# Patient Record
Sex: Female | Born: 1955
Health system: Southern US, Community
[De-identification: ages and names within clinical notes are randomized; demographics above are authoritative.]

## PROBLEM LIST (undated history)

## (undated) DIAGNOSIS — E663 Overweight: Secondary | ICD-10-CM

## (undated) DIAGNOSIS — K5730 Diverticulosis of large intestine without perforation or abscess without bleeding: Secondary | ICD-10-CM

## (undated) DIAGNOSIS — I341 Nonrheumatic mitral (valve) prolapse: Secondary | ICD-10-CM

## (undated) DIAGNOSIS — T7840XA Allergy, unspecified, initial encounter: Secondary | ICD-10-CM

## (undated) DIAGNOSIS — I10 Essential (primary) hypertension: Secondary | ICD-10-CM

## (undated) DIAGNOSIS — M545 Low back pain, unspecified: Secondary | ICD-10-CM

## (undated) DIAGNOSIS — G5600 Carpal tunnel syndrome, unspecified upper limb: Secondary | ICD-10-CM

## (undated) DIAGNOSIS — K2970 Gastritis, unspecified, without bleeding: Secondary | ICD-10-CM

## (undated) DIAGNOSIS — M1990 Unspecified osteoarthritis, unspecified site: Secondary | ICD-10-CM

## (undated) HISTORY — PX: KNEE ARTHROSCOPY: SUR90

## (undated) HISTORY — DX: Low back pain, unspecified: M54.50

## (undated) HISTORY — DX: Unspecified osteoarthritis, unspecified site: M19.90

## (undated) HISTORY — DX: Overweight: E66.3

## (undated) HISTORY — DX: Gastritis, unspecified, without bleeding: K29.70

## (undated) HISTORY — DX: Low back pain: M54.5

## (undated) HISTORY — DX: Nonrheumatic mitral (valve) prolapse: I34.1

## (undated) HISTORY — PX: OTHER SURGICAL HISTORY: SHX169

## (undated) HISTORY — DX: Allergy, unspecified, initial encounter: T78.40XA

## (undated) HISTORY — DX: Essential (primary) hypertension: I10

## (undated) HISTORY — DX: Carpal tunnel syndrome, unspecified upper limb: G56.00

## (undated) HISTORY — DX: Diverticulosis of large intestine without perforation or abscess without bleeding: K57.30

---

## 1999-06-18 ENCOUNTER — Other Ambulatory Visit: Admission: RE | Admit: 1999-06-18 | Discharge: 1999-06-18 | Payer: Self-pay | Admitting: *Deleted

## 2000-09-16 ENCOUNTER — Other Ambulatory Visit: Admission: RE | Admit: 2000-09-16 | Discharge: 2000-09-16 | Payer: Self-pay | Admitting: *Deleted

## 2001-11-16 ENCOUNTER — Other Ambulatory Visit: Admission: RE | Admit: 2001-11-16 | Discharge: 2001-11-16 | Payer: Self-pay | Admitting: Obstetrics and Gynecology

## 2002-12-28 ENCOUNTER — Encounter: Admission: RE | Admit: 2002-12-28 | Discharge: 2002-12-28 | Payer: Self-pay | Admitting: Obstetrics and Gynecology

## 2002-12-28 ENCOUNTER — Encounter: Payer: Self-pay | Admitting: Obstetrics and Gynecology

## 2003-09-30 ENCOUNTER — Other Ambulatory Visit: Admission: RE | Admit: 2003-09-30 | Discharge: 2003-09-30 | Payer: Self-pay | Admitting: Obstetrics and Gynecology

## 2004-07-03 ENCOUNTER — Ambulatory Visit: Payer: Self-pay | Admitting: Pulmonary Disease

## 2005-07-17 ENCOUNTER — Ambulatory Visit: Payer: Self-pay | Admitting: Pulmonary Disease

## 2006-07-03 ENCOUNTER — Ambulatory Visit: Payer: Self-pay | Admitting: Pulmonary Disease

## 2007-07-22 DIAGNOSIS — K2990 Gastroduodenitis, unspecified, without bleeding: Secondary | ICD-10-CM

## 2007-07-22 DIAGNOSIS — I10 Essential (primary) hypertension: Secondary | ICD-10-CM | POA: Insufficient documentation

## 2007-07-22 DIAGNOSIS — K2970 Gastritis, unspecified, without bleeding: Secondary | ICD-10-CM | POA: Insufficient documentation

## 2007-07-22 DIAGNOSIS — I059 Rheumatic mitral valve disease, unspecified: Secondary | ICD-10-CM | POA: Insufficient documentation

## 2007-07-23 ENCOUNTER — Ambulatory Visit: Payer: Self-pay | Admitting: Pulmonary Disease

## 2007-07-23 LAB — CONVERTED CEMR LAB
ALT: 22 units/L (ref 0–35)
AST: 18 units/L (ref 0–37)
Basophils Relative: 0.8 % (ref 0.0–1.0)
Bilirubin, Direct: 0.2 mg/dL (ref 0.0–0.3)
CO2: 30 meq/L (ref 19–32)
Calcium: 10 mg/dL (ref 8.4–10.5)
Chloride: 103 meq/L (ref 96–112)
Eosinophils Relative: 1 % (ref 0.0–5.0)
GFR calc non Af Amer: 94 mL/min
Glucose, Bld: 93 mg/dL (ref 70–99)
LDL Cholesterol: 112 mg/dL — ABNORMAL HIGH (ref 0–99)
Platelets: 308 10*3/uL (ref 150–400)
RBC: 4.2 M/uL (ref 3.87–5.11)
RDW: 12.2 % (ref 11.5–14.6)
Total CHOL/HDL Ratio: 2.5
Total Protein: 7.2 g/dL (ref 6.0–8.3)
Triglycerides: 49 mg/dL (ref 0–149)
VLDL: 10 mg/dL (ref 0–40)
WBC: 5 10*3/uL (ref 4.5–10.5)

## 2007-08-14 ENCOUNTER — Telehealth: Payer: Self-pay | Admitting: Pulmonary Disease

## 2007-09-17 ENCOUNTER — Encounter: Admission: RE | Admit: 2007-09-17 | Discharge: 2007-09-17 | Payer: Self-pay | Admitting: Obstetrics and Gynecology

## 2008-03-21 ENCOUNTER — Ambulatory Visit: Payer: Self-pay | Admitting: Gastroenterology

## 2008-04-01 ENCOUNTER — Telehealth: Payer: Self-pay | Admitting: Gastroenterology

## 2008-04-01 ENCOUNTER — Ambulatory Visit: Payer: Self-pay | Admitting: Gastroenterology

## 2008-04-01 HISTORY — PX: COLONOSCOPY: SHX174

## 2008-07-20 ENCOUNTER — Ambulatory Visit: Payer: Self-pay | Admitting: Pulmonary Disease

## 2008-07-20 DIAGNOSIS — M545 Low back pain, unspecified: Secondary | ICD-10-CM | POA: Insufficient documentation

## 2008-07-20 DIAGNOSIS — M1990 Unspecified osteoarthritis, unspecified site: Secondary | ICD-10-CM | POA: Insufficient documentation

## 2008-07-20 DIAGNOSIS — G5600 Carpal tunnel syndrome, unspecified upper limb: Secondary | ICD-10-CM | POA: Insufficient documentation

## 2008-07-20 DIAGNOSIS — E663 Overweight: Secondary | ICD-10-CM | POA: Insufficient documentation

## 2008-07-24 DIAGNOSIS — K5730 Diverticulosis of large intestine without perforation or abscess without bleeding: Secondary | ICD-10-CM | POA: Insufficient documentation

## 2008-07-24 LAB — CONVERTED CEMR LAB
AST: 24 units/L (ref 0–37)
Albumin: 3.9 g/dL (ref 3.5–5.2)
CO2: 30 meq/L (ref 19–32)
Chloride: 103 meq/L (ref 96–112)
Eosinophils Relative: 2 % (ref 0.0–5.0)
Glucose, Bld: 93 mg/dL (ref 70–99)
HCT: 37.2 % (ref 36.0–46.0)
HDL: 87.8 mg/dL (ref >39.0)
Hemoglobin, Urine: NEGATIVE
LDL Cholesterol: 106 mg/dL — ABNORMAL HIGH (ref 0–99)
Lymphocytes Relative: 34.2 % (ref 12.0–46.0)
Monocytes Absolute: 0.4 10*3/uL (ref 0.1–1.0)
Monocytes Relative: 8.8 % (ref 3.0–12.0)
Mucus, UA: NEGATIVE
Nitrite: NEGATIVE
Platelets: 337 10*3/uL (ref 150–400)
Potassium: 4.2 meq/L (ref 3.5–5.1)
RBC / HPF: NONE SEEN
Sodium: 139 meq/L (ref 135–145)
Specific Gravity, Urine: 1.01 (ref 1.000–1.03)
TSH: 1.45 microintl units/mL (ref 0.35–5.50)
Total CHOL/HDL Ratio: 2.3
Total Protein, Urine: NEGATIVE mg/dL
Urine Glucose: NEGATIVE mg/dL
WBC, UA: NONE SEEN cells/hpf
WBC: 4.6 10*3/uL (ref 4.5–10.5)
pH: 5.5 (ref 5.0–8.0)

## 2008-09-05 ENCOUNTER — Encounter: Admission: RE | Admit: 2008-09-05 | Discharge: 2008-09-05 | Payer: Self-pay | Admitting: Obstetrics and Gynecology

## 2008-11-15 ENCOUNTER — Encounter: Payer: Self-pay | Admitting: Pulmonary Disease

## 2008-11-29 ENCOUNTER — Inpatient Hospital Stay (HOSPITAL_COMMUNITY): Admission: RE | Admit: 2008-11-29 | Discharge: 2008-12-01 | Payer: Self-pay | Admitting: Orthopedic Surgery

## 2008-12-27 ENCOUNTER — Inpatient Hospital Stay (HOSPITAL_COMMUNITY): Admission: RE | Admit: 2008-12-27 | Discharge: 2008-12-29 | Payer: Self-pay | Admitting: Orthopedic Surgery

## 2009-07-04 ENCOUNTER — Ambulatory Visit: Payer: Self-pay | Admitting: Internal Medicine

## 2009-09-06 ENCOUNTER — Encounter: Admission: RE | Admit: 2009-09-06 | Discharge: 2009-09-06 | Payer: Self-pay | Admitting: Obstetrics and Gynecology

## 2010-08-17 ENCOUNTER — Ambulatory Visit: Payer: Self-pay | Admitting: Pulmonary Disease

## 2010-08-19 LAB — CONVERTED CEMR LAB
AST: 19 units/L (ref 0–37)
BUN: 14 mg/dL (ref 6–23)
Basophils Relative: 0.7 % (ref 0.0–3.0)
Blood, UA: NEGATIVE
Eosinophils Relative: 2.1 % (ref 0.0–5.0)
GFR calc non Af Amer: 104.78 mL/min (ref >60.00)
HCT: 38.9 % (ref 36.0–46.0)
LDL Cholesterol: 104 mg/dL — ABNORMAL HIGH (ref 0–99)
Lymphs Abs: 1.5 10*3/uL (ref 0.7–4.0)
MCV: 91.4 fL (ref 78.0–100.0)
Monocytes Absolute: 0.5 10*3/uL (ref 0.1–1.0)
Neutro Abs: 2.2 10*3/uL (ref 1.4–7.7)
Nitrite: NEGATIVE
Platelets: 338 10*3/uL (ref 150.0–400.0)
Potassium: 4.5 meq/L (ref 3.5–5.1)
RBC: 4.26 M/uL (ref 3.87–5.11)
Sodium: 138 meq/L (ref 135–145)
TSH: 1.21 microintl units/mL (ref 0.35–5.50)
Total Bilirubin: 1 mg/dL (ref 0.3–1.2)
Total CHOL/HDL Ratio: 2
Urobilinogen, UA: 0.2 (ref 0.0–1.0)
WBC: 4.3 10*3/uL — ABNORMAL LOW (ref 4.5–10.5)

## 2010-10-04 NOTE — Assessment & Plan Note (Signed)
Summary: ROV/ MBW   CC:  2 year ROV & review....  History of Present Illness: 55 y/o WF here for a follow up visit... she has multiple medical problems as noted below...   ~  August 17, 2010:  79yr [place]- she had bilat TKR's in the spring of 2010 by DrOlin, now much improved  & ambulating better w/ Prn Ibuprofen... BP controlled on meds;  denies CP, palpit, SOB, etc;  GI stable and up to date;  CXR & Fasting blood work looks good today...    Current Problem List:  HYPERTENSION (ICD-401.9) - on BENICAR/HCT 40-12.5 daily... BP = 128/76 today & 120's/ 70's at home, feeling well, taking med regularly & tolerating well... denies HA, fatigue, visual changes, CP, palipit, dizziness, syncope, dyspnea, edema, etc...   ~  CXR 12/11 is WNL, mild DJD spine noted...  MITRAL VALVE PROLAPSE (ICD-424.0) - clinical diagnosis she has carried for yrs w/ MSC on exam and prev hx of mild intermittent sharp CP's and palpit that were self limited... baseline EKG= NSR, WNL.Marland Kitchen. never had 2DEcho... no symptoms for several yrs- without CP, plapit, dyspnea, etc...  OVERWEIGHT (ICD-278.02) - weight = 175# today (down 5#)... average weight was  ~160# for yrs... we discussed diet + exercise therapy...  ~  FLP 12/11 showed TChol 184, TG 30, HDL 74, LDL 104  GASTRITIS (ICD-535.50) - on OTC PRILOSEC Prn... the gastritis in the past was believed secondary to NSAID's and she minimizes her exposure ever since then...  DIVERTICULOSIS OF COLON (ICD-562.10) - colonoscopy 7/09 showed divertics, sm hemorroid, no polyps... f/u planned 35yrs.  DEGENERATIVE JOINT DISEASE (ICD-715.90) - long hx of right knee arthritis and she uses ADVIL (best tolerated of the NSAIDs) Prn... s/p arthroscopy by DrPresson yrs ago... now sees DrOlin & she's been told she needs a TKR (shot in knee helped justy 2 months... she is holding off as long as poss... we discussed a trial of Mobic take w/ food.  ~  s/p bilat TKRs by DrOlin- left 3/10 & right  4/10...  Hx of CARPAL TUNNEL SYNDROME (ICD-354.0)  BACK PAIN, LUMBAR (ICD-724.2) - states incr LBP after working around the house... went to [facility] where XRays were said to show DDD... pain improved over time.  Health Maintenance - GYN= DrTaavon for PAP smears, Mammograms, etc... she gets the yearly seasonal flu vaccines... ?had Tetanus shot from GYN in past... OK PNEUMOVAX 12/11...    Preventive Screening-Counseling & Management  Alcohol-Tobacco     Smoking Status: never  Allergies: 1)  ! * Oxycodone  Comments:  Nurse/Medical Assistant: The patient's medications and allergies were reviewed with the patient and were updated in the Medication and Allergy Lists.  Past History:  Past Medical History: HYPERTENSION (ICD-401.9) MITRAL VALVE PROLAPSE (ICD-424.0) OVERWEIGHT (ICD-278.02) GASTRITIS (ICD-535.50) DIVERTICULOSIS OF COLON (ICD-562.10) DEGENERATIVE JOINT DISEASE (ICD-715.90) Hx of CARPAL TUNNEL SYNDROME (ICD-354.0) BACK PAIN, LUMBAR (ICD-724.2)  Past Surgical History: S/P knee arthroscopy S/P bilat TKRs by DrOlin- left 3/10 & right 4/10  Family History: Reviewed history from 07/20/2008 and no changes required. Father= Margarito Liner age 26 w/ AAA, prostate ca, 2 prev TKR's... Mother age 56 in good general health, w/ DJD in spine... 3 Siblings- all brothers- 1 Bro died age 24 w/ enlarged heart 2 other brothers alive & well  Social History: Reviewed history from 07/20/2008 and no changes required. 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  The patient denies fever, chills, sweats, anorexia, fatigue, weakness, malaise, weight loss, sleep disorder, blurring, diplopia, eye irritation, eye discharge, vision loss, eye pain, photophobia, earache, ear discharge, tinnitus, decreased hearing, nasal congestion, nosebleeds, sore throat,  hoarseness, chest pain, palpitations, syncope, orthopnea, PND, peripheral edema, cough, dyspnea at rest, excessive sputum, hemoptysis, wheezing, pleurisy, nausea, vomiting, diarrhea, constipation, change in bowel habits, abdominal pain, melena, hematochezia, jaundice, gas/bloating, indigestion/heartburn, dysphagia, odynophagia, dysuria, hematuria, urinary frequency, urinary hesitancy, nocturia, incontinence, back pain, joint pain, joint swelling, muscle cramps, muscle weakness, sciatica, restless legs, leg pain at night, leg pain with exertion, rash, itching, dryness, suspicious lesions, paralysis, paresthesias, seizures, tremors, vertigo, transient blindness, frequent falls, frequent headaches, difficulty walking, depression, anxiety, memory loss, confusion, cold intolerance, heat intolerance, polydipsia, polyphagia, polyuria, unusual weight change, abnormal bruising, bleeding, enlarged lymph nodes, urticaria, allergic rash, hay fever, and recurrent infections.    Vital Signs:  Patient profile:   55 year old female Height:      62 inches Weight:      175 pounds BMI:     32.12 O2 Sat:      99 % on Room air Temp:     99.0 degrees F oral Pulse rate:   80 / minute BP sitting:   128 / 76  (right arm) Cuff size:   regular  Vitals Entered By: Randell Loop CMA (August 17, 2010 10:48 AM)  O2 Sat at Rest %:  99 O2 Flow:  Room air CC: 2 year ROV & review... Is Patient Diabetic? No Pain Assessment Patient in pain? no      Comments meds updated today with pt   Physical Exam  Additional Exam:  WD, WN, 55 y/o WF in NAD... GENERAL:  Alert & oriented; pleasant & cooperative... HEENT:  [place]/AT, EOM-wnl, PERRLA, EACs-clear, TMs-wnl, NOSE-clear, THROAT-clear & wnl. NECK:  Supple w/ full ROM; no JVD; normal carotid impulses w/o bruits; no thyromegaly or nodules palpated; no lymphadenopathy. CHEST:  Clear to P & A; without wheezes/ rales/ or rhonchi. HEART:  Regular Rhythm; without murmurs/ rubs/ or  gallops. ABDOMEN:  Soft & nontender; normal bowel sounds; no organomegaly or masses detected. EXT:  s/p bilat TKRs; no varicose veins/ venous insuffic/ or edema. NEURO:  intact w/ no focal deficits noted.  DERM:  No lesions noted; no rash etc...    CXR  Procedure date:  08/17/2010  Findings:      CHEST - 2 VIEW Comparison: None   Findings: Heart and mediastinal contours are within normal limits. No focal opacities or effusions.  No acute bony abnormality.  Early degenerative changes in the thoracic spine.   IMPRESSION: No active disease.   Read By:  Charlett Nose,  M.D.   MISC. Report  Procedure date:  08/17/2010  Findings:      BMP (METABOL)   Sodium                    138 mEq/L                   135-145   Potassium                 4.5 mEq/L                   3.5-5.1   Chloride                  99 mEq/L  96-112   Carbon Dioxide            31 mEq/L                    19-32   Glucose                   83 mg/dL                    16-10   BUN                       14 mg/dL                    9-60   Creatinine                0.6 mg/dL                   4.5-4.0   Calcium                   9.5 mg/dL                   9.8-11.9   GFR                       104.78 mL/min               >60.00  Hepatic/Liver Function Panel (HEPATIC)   Total Bilirubin           1.0 mg/dL                   1.4-7.8   Direct Bilirubin          0.1 mg/dL                   2.9-5.6   Alkaline Phosphatase      85 U/L                      39-117   AST                       19 U/L                      0-37   ALT                       21 U/L                      0-35   Total Protein             7.0 g/dL                    2.1-3.0   Albumin                   3.8 g/dL                    8.6-5.7  CBC Platelet w/Diff (CBCD)   White Cell Count     [L]  4.3 K/uL                    4.5-10.5   Red Cell Count            4.26 Mil/uL  3.87-5.11   Hemoglobin                13.4 g/dL                    16.1-09.6   Hematocrit                38.9 %                      36.0-46.0   MCV                       91.4 fl                     78.0-100.0   Platelet Count            338.0 K/uL                  150.0-400.0   Neutrophil %              51.5 %                      43.0-77.0   Lymphocyte %              34.3 %                      12.0-46.0   Monocyte %                11.4 %                      3.0-12.0   Eosinophils%              2.1 %                       0.0-5.0   Basophils %               0.7 %                       0.0-3.0  Comments:      Lipid Panel (LIPID)   Cholesterol               184 mg/dL                   0-454   Triglycerides             30.0 mg/dL                  0.9-811.9   HDL                       14.78 mg/dL                 >29.56   LDL Cholesterol      [H]  213 mg/dL                   0-86  TSH (TSH)   FastTSH                   1.21 uIU/mL                 0.35-5.50  UDip w/Micro (URINE)   Color  LT. YELLOW   Clarity                   CLEAR                       Clear   Specific Gravity          <=1.005                     1.000 - 1.030   Urine Ph                  7.0                         5.0-8.0   Protein                   NEGATIVE                    Negative   Urine Glucose             NEGATIVE                    Negative   Ketones                   NEGATIVE                    Negative   Urine Bilirubin           NEGATIVE                    Negative   Blood                     NEGATIVE                    Negative   Urobilinogen              0.2                         0.0 - 1.0   Leukocyte Esterace        MODERATE                    Negative   Nitrite                   NEGATIVE                    Negative   Urine WBC                 0-2/hpf                     0-2/hpf   Urine Mucus               Presence of                 None   Urine Epith               Rare(0-4/hpf)               Rare(0-4/hpf)   Urine Bacteria             Rare(<10/hpf)               None  Impression & Recommendations:  Problem #  1:  PHYSICAL EXAMINATION (ICD-V70.0)  Orders: T-2 View CXR (71020TC) TLB-BMP (Basic Metabolic Panel-BMET) (80048-METABOL) TLB-Hepatic/Liver Function Pnl (80076-HEPATIC) TLB-CBC Platelet - w/Differential (85025-CBCD) TLB-Lipid Panel (80061-LIPID) TLB-TSH (Thyroid Stimulating Hormone) (84443-TSH) TLB-Udip w/ Micro (81001-URINE)  Problem # 2:  HYPERTENSION (ICD-401.9) Controlled>  continue same meds. Her updated medication list for this problem includes:    Benicar Hct 40-12.5 Mg Tabs (Olmesartan medoxomil-hctz) .Marland Kitchen... Take 1 tablet by mouth once a day  Problem # 3:  OVERWEIGHT (ICD-278.02) Weight down to 175#... keep up the good work...  Problem # 4:  DIVERTICULOSIS OF COLON (ICD-562.10) GI is stable>  up to date...  Problem # 5:  DEGENERATIVE JOINT DISEASE (ICD-715.90) s/p  bilat TKRs from DrOlin & improved... The following medications were removed from the medication list:    Meloxicam 15 Mg Tabs (Meloxicam) .Marland Kitchen... Take 1 tab by mouth once daily w/ a meal as needed for arthritis pain...  Problem # 6:  OTHER MEDICAL ISSUES AS NOTED>>> OK Pneumovax today...  Complete Medication List: 1)  Benicar Hct 40-12.5 Mg Tabs (Olmesartan medoxomil-hctz) .... Take 1 tablet by mouth once a day  Other Orders: Pneumococcal Vaccine (44010) Admin 1st Vaccine (27253)  Patient Instructions: 1)  Today we updated your med list- see below.... 2)  We did your follow up CXR & FASTING blood work... 3)  please call the "phone tree" in a few days for your lab results.Marland KitchenMarland Kitchen 4)  Keep up the good work w/ your exercise program... 5)  We gave you a PNEUMONIA vaccine today (you will need a second shot after age 56)... 6)  Check w/ your GYN regarding your last tetanus shot... 7)  Call for any problems... 8)  Please schedule a follow-up appointment in 1 year. Prescriptions: BENICAR HCT 40-12.5 MG  TABS (OLMESARTAN  MEDOXOMIL-HCTZ) Take 1 tablet by mouth once a day  #90 x 4   Entered and Authorized by:   Michele Mcalpine MD   Signed by:   Michele Mcalpine MD on 08/17/2010   Method used:   Print then Give to Patient   RxID:   [id number]    Immunization History:  Influenza Immunization History:    Influenza:  historical (07/16/2010)  Immunizations Administered:  Pneumonia Vaccine:    Vaccine Type: Pneumovax    Site: right deltoid    Mfr: Merck    Dose: 0.5 ml    Route: IM    Given by: Randell Loop CMA    Exp. Date: 01/10/2012    Lot #: [id number]    VIS given: 08/07/09 version given August 17, 2010.

## 2010-10-23 ENCOUNTER — Other Ambulatory Visit: Payer: Self-pay | Admitting: Obstetrics and Gynecology

## 2010-10-23 DIAGNOSIS — Z1231 Encounter for screening mammogram for malignant neoplasm of breast: Secondary | ICD-10-CM

## 2010-11-06 ENCOUNTER — Ambulatory Visit
Admission: RE | Admit: 2010-11-06 | Discharge: 2010-11-06 | Disposition: A | Discharge status: Home / Self Care | Payer: 59 | Source: Ambulatory Visit | Attending: Obstetrics and Gynecology | Admitting: Obstetrics and Gynecology

## 2010-11-06 DIAGNOSIS — Z1231 Encounter for screening mammogram for malignant neoplasm of breast: Secondary | ICD-10-CM

## 2010-12-12 LAB — DIFFERENTIAL
Basophils Relative: 1 % (ref 0–1)
Eosinophils Absolute: 0.1 10*3/uL (ref 0.0–0.7)
Eosinophils Relative: 3 % (ref 0–5)
Monocytes Relative: 8 % (ref 3–12)
Neutrophils Relative %: 57 % (ref 43–77)

## 2010-12-12 LAB — URINALYSIS, ROUTINE W REFLEX MICROSCOPIC
Bilirubin Urine: NEGATIVE
Glucose, UA: NEGATIVE mg/dL
Hgb urine dipstick: NEGATIVE
Specific Gravity, Urine: 1.014 (ref 1.005–1.030)
Urobilinogen, UA: 0.2 mg/dL (ref 0.0–1.0)
pH: 6 (ref 5.0–8.0)

## 2010-12-12 LAB — CBC
HCT: 23.5 % — ABNORMAL LOW (ref 36.0–46.0)
HCT: 25.4 % — ABNORMAL LOW (ref 36.0–46.0)
HCT: 36.4 % (ref 36.0–46.0)
HCT: 26.3 % — ABNORMAL LOW (ref 36.0–46.0)
Hemoglobin: 8.1 g/dL — ABNORMAL LOW (ref 12.0–15.0)
Hemoglobin: 8.6 g/dL — ABNORMAL LOW (ref 12.0–15.0)
MCHC: 33.5 g/dL (ref 30.0–36.0)
MCV: 92.2 fL (ref 78.0–100.0)
MCV: 92.3 fL (ref 78.0–100.0)
MCV: 92.4 fL (ref 78.0–100.0)
Platelets: 210 10*3/uL (ref 150–400)
Platelets: 459 10*3/uL — ABNORMAL HIGH (ref 150–400)
Platelets: 239 10*3/uL (ref 150–400)
RBC: 2.74 MIL/uL — ABNORMAL LOW (ref 3.87–5.11)
RBC: 3.95 MIL/uL (ref 3.87–5.11)
RDW: 13.7 % (ref 11.5–15.5)
RDW: 13.5 % (ref 11.5–15.5)
WBC: 10 10*3/uL (ref 4.0–10.5)
WBC: 7.1 10*3/uL (ref 4.0–10.5)

## 2010-12-12 LAB — BASIC METABOLIC PANEL
BUN: 10 mg/dL (ref 6–23)
BUN: 13 mg/dL (ref 6–23)
BUN: 7 mg/dL (ref 6–23)
CO2: 28 mEq/L (ref 19–32)
Calcium: 8.4 mg/dL (ref 8.4–10.5)
Chloride: 100 mEq/L (ref 96–112)
Chloride: 104 mEq/L (ref 96–112)
Chloride: 102 mEq/L (ref 96–112)
Creatinine, Ser: 0.78 mg/dL (ref 0.4–1.2)
Creatinine, Ser: 0.62 mg/dL (ref 0.4–1.2)
GFR calc Af Amer: >60 mL/min (ref >60)
GFR calc non Af Amer: >60 mL/min (ref >60)
GFR calc non Af Amer: >60 mL/min (ref >60)
GFR calc non Af Amer: >60 mL/min (ref >60)
Glucose, Bld: 98 mg/dL (ref 70–99)
Glucose, Bld: 98 mg/dL (ref 70–99)
Potassium: 3.9 mEq/L (ref 3.5–5.1)
Potassium: 4.2 mEq/L (ref 3.5–5.1)
Potassium: 4.5 mEq/L (ref 3.5–5.1)
Potassium: 3.9 mEq/L (ref 3.5–5.1)
Sodium: 134 mEq/L — ABNORMAL LOW (ref 135–145)
Sodium: 136 mEq/L (ref 135–145)

## 2010-12-12 LAB — TYPE AND SCREEN
ABO/RH(D): O POS
Antibody Screen: NEGATIVE

## 2010-12-12 LAB — PROTIME-INR: INR: 1 (ref 0.00–1.49)

## 2010-12-13 LAB — CBC
HCT: 25.9 % — ABNORMAL LOW (ref 36.0–46.0)
HCT: 37.4 % (ref 36.0–46.0)
Hemoglobin: 8.8 g/dL — ABNORMAL LOW (ref 12.0–15.0)
MCV: 92.9 fL (ref 78.0–100.0)
Platelets: 331 10*3/uL (ref 150–400)
RDW: 13.3 % (ref 11.5–15.5)
RDW: 13.4 % (ref 11.5–15.5)
WBC: 4.9 10*3/uL (ref 4.0–10.5)

## 2010-12-13 LAB — BASIC METABOLIC PANEL
BUN: 12 mg/dL (ref 6–23)
BUN: 13 mg/dL (ref 6–23)
Calcium: 9.3 mg/dL (ref 8.4–10.5)
Creatinine, Ser: 0.67 mg/dL (ref 0.4–1.2)
Creatinine, Ser: 0.72 mg/dL (ref 0.4–1.2)
GFR calc non Af Amer: >60 mL/min (ref >60)
GFR calc non Af Amer: >60 mL/min (ref >60)
Glucose, Bld: 101 mg/dL — ABNORMAL HIGH (ref 70–99)
Glucose, Bld: 91 mg/dL (ref 70–99)
Potassium: 4 mEq/L (ref 3.5–5.1)

## 2010-12-13 LAB — DIFFERENTIAL
Basophils Absolute: 0 10*3/uL (ref 0.0–0.1)
Eosinophils Relative: 2 % (ref 0–5)
Lymphocytes Relative: 37 % (ref 12–46)
Neutro Abs: 2.6 10*3/uL (ref 1.7–7.7)
Neutrophils Relative %: 53 % (ref 43–77)

## 2010-12-13 LAB — URINALYSIS, ROUTINE W REFLEX MICROSCOPIC
Bilirubin Urine: NEGATIVE
Ketones, ur: NEGATIVE mg/dL
Nitrite: NEGATIVE
Urobilinogen, UA: 0.2 mg/dL (ref 0.0–1.0)

## 2010-12-13 LAB — PROTIME-INR
INR: 0.9 (ref 0.00–1.49)
Prothrombin Time: 12.7 seconds (ref 11.6–15.2)

## 2010-12-13 LAB — TYPE AND SCREEN: ABO/RH(D): O POS

## 2010-12-13 LAB — ABO/RH: ABO/RH(D): O POS

## 2011-01-15 NOTE — Discharge Summary (Signed)
[name], [name]               ACCOUNT NO.:  [id number]   MEDICAL RECORD NO.:  [id number]          PATIENT TYPE:  INP   LOCATION:  1614                         FACILITY:  [facility]   PHYSICIAN:  Madlyn Frankel. Charlann Boxer, M.D.  DATE OF BIRTH:  [birth year]   DATE OF ADMISSION:  11/29/2008  DATE OF DISCHARGE:  12/01/2008                               DISCHARGE SUMMARY   ADMITTING DIAGNOSES:  1. Osteoarthritis.  2. History of mitral valve prolapse.  3. Postmenopausal.  4. Degenerative disk disease.   DISCHARGE DIAGNOSES:  1. Osteoarthritis.  2. History of mitral valve prolapse.  3. Postmenopausal.  4. Degenerative disk disease.   HISTORY OF PRESENT ILLNESS:  A 55 year old female with a history of left  knee pain secondary to osteoarthritis.  Refractory to all conservative  treatment.   CONSULTS:  None.   PROCEDURE:  Left total knee replaced by surgeon Dr. Durene Romans.   ASSISTANT:  Dwyane Luo, PA-C.   LABORATORY DATA:  CBC final reading:  White blood cells 7.1, hemoglobin  8.9, hematocrit 26.3, platelets 239.  Metabolic:  Sodium 134, potassium  3.9, BUN 7, creatinine 0.62, glucose 98.   HOSPITAL COURSE:  The patient admitted to the hospital, underwent a left  total knee replacement, admitted to orthopedic floor.  Her stay was  unremarkable.  She remained hemodynamically and orthopedically stable  throughout her course of stay.  Her dressing was changed.  The wound had  no significant drainage.  She remained neurovascularly intact to the  left lower extremity with improved quad function throughout her course  of stay.  She was weightbearing as tolerated by day 2 and met all  criteria for discharge home.   DISCHARGE DISPOSITION:  Discharged home in stable improved condition.   DISCHARGE PHYSICAL THERAPY:  Weightbearing as tolerated.   DISCHARGE MEDICATIONS:  1. Aspirin 325 mg p.o. b.i.d. x6 weeks.  2. Robaxin 500 mg p.o. q. 6 p.r.n. muscle spasm pain.  3. Iron 325 mg 1 p.o. t.i.d.  x1 week.  4. MiraLax 17 grams 1 p.o. daily p.r.n. constipation.  5. Norco 7.5/325 one to two p.o. q. 4-6 p.r.n. pain.  6. Calcium plus vitamin D b.i.d.  7. Vitamin D 1000 units daily.  8. Vagifem 25 mcg twice weekly.  9. Prilosec 20 mg p.r.n.  10.Celebrex 200 mg 1 p.o. b.i.d. x2 weeks after surgery.  11.Benicar/HCTZ 40/12.5 mg 1 p.o. q.a.m.   DISCHARGE DIET:  Heart healthy.   DISCHARGE WOUND CARE:  Keep dry.   DISCHARGE FOLLOWUP:  Follow up with Dr. Charlann Boxer at [phone] in weeks.     ______________________________  Yetta Glassman [address], [place]      Madlyn Frankel. Charlann Boxer, M.D.  Electronically Signed    BLM/MEDQ  D:  12/01/2008  T:  12/01/2008  Job:  [id number]   cc:   Lonzo Cloud. Kriste Basque, MD  [address]  [place]  [place] [address]

## 2011-01-15 NOTE — Op Note (Signed)
NAMEJACQUELYNE, [name]               ACCOUNT NO.:  [id number]   MEDICAL RECORD NO.:  [id number]          PATIENT TYPE:  INP   LOCATION:  1619                         FACILITY:  [facility]   PHYSICIAN:  Madlyn Frankel. Charlann Boxer, M.D.  DATE OF BIRTH:  04/01/1956   DATE OF PROCEDURE:  12/27/2008  DATE OF DISCHARGE:                               OPERATIVE REPORT   PREOPERATIVE DIAGNOSIS:  Right knee osteoarthritis.   POSTOPERATIVE DIAGNOSIS:  Right knee osteoarthritis.   PROCEDURE:  Right total knee replacement.   COMPONENTS USED:  DePuy rotating platform posterior stabilized knee  system with a size 3 femur, 2.5 tibia, a 15-mm insert and a 35 patella  button.   SURGEON:  Madlyn Frankel. Charlann Boxer, M.D.   ASSISTANT:  Yetta Glassman. Mann, P.A.-C.   ANESTHESIA:  Tetracaine spinal.   SPECIMENS:  None.   FINDINGS:  None.   DRAINS:  One Hemovac.   COMPLICATIONS:  None.   TOURNIQUET TIME:  33 minutes at 250 mmHg.   INDICATIONS FOR THE PROCEDURE:  [name] is a 55 year old patient of  mine with a history of a left total knee replacement that has done well  early on.  She at this point was scheduled for her right total knee.  Risks and benefits were reviewed for the total knee replacement as she  had just recently gone through this a couple of months ago.  Consent was  obtained for the benefit of pain relief.   PROCEDURE IN DETAIL:  The patient was brought to the operative theater.  Once adequate anesthesia and preoperative antibiotics, Ancef  administered, the patient was positioned supine.  Thigh tourniquet was  placed.  The right lower extremity was then prescrubbed, prepped and  draped in a sterile fashion.  Time-out was performed, identifying the  patient, planned procedure and extremity.  The foot was placed in a  legholder.  The leg was exsanguinated, tourniquet elevated to 250 mmHg.   With the leg in flexion, a midline incision was made followed by  exposing the extensor mechanism.  Medial  arthrotomy was made.  Following  initial debridement, attention was directed to the patella.  Precut  measurement was 20 mm.  I resected down to 13-14 mm and used a 35  patellar button.  Lug holes were drilled and a metal shim placed on the  cut surface to protect it from retractors and saw blades.   Attention was now directed to the femur.  Following debridement of  meniscal stumps and cruciate ligaments, the femoral canal was opened  with the drill, irrigated to prevent fat emboli.  An intramedullary rod  was then passed and at 3 degrees of valgus 10 mm of bone was resected  off the distal femur.   Attention was now directed to the tibia.  The tibia was subluxated  anteriorly.  Using the extramedullary guide, a perpendicular was cut  measuring 10 mm off the proximal lateral tibia.  I then checked with the  extensor block to make sure that I was going to at least be 10 mm into  the extension gap.  Once  this was confirmed, attention was redirected  back to the femur.  Femoral sizing was measured to be a size 3, and  rotation was set off the proximal tibial cut with a C clamp.  The  rotation block was pinned into position and exchanged for the 4-in-1  cutting block.  The anterior, posterior and chamfer cuts were then made  without difficulty nor notching.   The final box cut was made off the lateral aspect of the distal femur.   Attention was then redirected back to the tibia.  With the tibia  subluxated anteriorly the size 2.5 tibial tray seemed to fit best on the  cut surface.  It was pinned into position through the medial third of  the tubercle, drilled and keel punched.  Trial reduction was now carried  out with the 3 femur, 2.5 tibia and the 12.5 insert.  The patella was  noted to track through the trochlea without application of pressure.  The knee came into a slight bit of hyperextension, leading me to decide  on using the 15-mm insert at the end of the case.   Trial  components were removed.  The synovial capsule junction was  injected with 60 mL of 0.25% Marcaine with epinephrine and 1 mL of  Toradol.  The knee was irrigated with normal saline solution as the  final components were opened and cement mixed.   Final components were cemented into position with the knee brought to  extension with a 15-mm insert.  Extruded cement was removed.  Once the  cement cured, excessive cement was removed throughout the knee.  The  final 15 insert was then placed and the knee reirrigated with Pulsavac.  Tourniquet was let down at 33 minutes.  A medium Hemovac drain was  placed deep.  The extensor mechanism was then reapproximated using #1  Vicryl with the knee in 90 degrees of flexion.  The remainder of the  wound was closed with 2-0 Vicryl and a running 4-0 Monocryl.  The knee  was cleaned, dried, and dressed sterilely with a bulky sterile wrap.  She was brought to the recovery room in stable condition, tolerating the  procedure well.      Madlyn Frankel Charlann Boxer, M.D.  Electronically Signed     MDO/MEDQ  D:  12/27/2008  T:  12/27/2008  Job:  [id number]

## 2011-01-15 NOTE — H&P (Signed)
[name], [name]               ACCOUNT NO.:  [id number]   MEDICAL RECORD NO.:  [id number]          PATIENT TYPE:  INP   LOCATION:  NA                           FACILITY:  [facility]   PHYSICIAN:  Madlyn Frankel. Charlann Boxer, M.D.  DATE OF BIRTH:  [birth year]   DATE OF ADMISSION:  12/27/2008  DATE OF DISCHARGE:                              HISTORY & PHYSICAL   REASON FOR ADMISSION:  Right total knee replacement.   CHIEF COMPLAINT:  Right knee pain.   HISTORY OF PRESENT ILLNESS:  A 55 year old female with a history of  right knee pain secondary to osteoarthritis.  It has been refractory to  all conservative treatment.  She does have a recent history of a left  total knee replacement and is doing well with her progress.   PRIMARY CARE PHYSICIAN:  Dr. Alroy Dust.   PAST MEDICAL HISTORY:  1. Osteoarthritis.  2. History of mitral valve prolapse.  3. Menopausal.  4. Degenerative disk disease.   PAST SURGICAL HISTORY:  Left total knee replacement, November 29, 2008.   FAMILY HISTORY:  Noncontributory.   SOCIAL HISTORY:  Nonsmoker.  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   DRUG ALLERGIES:  CODEINE CAUSES SEVERE NAUSEA AND VOMITING.   MEDICATIONS:  1. Benicar HCT 40/12.5 mg one p.o. q.a.m.  2. Prilosec 20 mg p.r.n.  3. Vagifem 25 mcg twice weekly.  4. Vitamin D 1000 units daily.  5. Calcium plus vitamin D b.i.d.  6. Celebrex 200 mg one p.o. b.i.d. x2 weeks after surgery.  7. Robaxin 500 mg one p.o. q.6 p.r.n. muscle spasm pain.   REVIEW OF SYSTEMS:  None other than HPI.   PHYSICAL EXAMINATION:  Pulse 72.  Respirations 16.  Blood pressure  118/78.  GENERAL:  Awake, alert, and oriented.  HEENT:  Normocephalic.  NECK:  Supple.  No carotid bruits.  CHEST:  Lung sounds clear to auscultation bilaterally.  BREASTS:  Deferred.  HEART:  S1 and S2 distinct.  ABDOMEN:  Soft.  Bowel sounds present.  PELVIS:  Stable.  GENITOURINARY:  Deferred.  EXTREMITIES:  Left knee with midline incision, healing well.   Right knee  with diffuse anterior knee tenderness.  SKIN:  No cellulitis right lower extremity.  Left knee with healing  wound.  NEUROLOGIC:  Intact distal sensibilities.   LABORATORY DATA:  Labs pending presurgical testing.   Chest x-ray and EKG done prior to previous left total knee replacement.   IMPRESSION:  1. Right knee osteoarthritis.  2. Status post left total knee replacement on November 29, 2008.   PLAN OF ACTION:  Right total knee replacement by Dr. Charlann Boxer on December 27, 2008.  Questions were encouraged, answered, and reviewed.  While in  hospital, physical therapy rehab should focus primarily on the right  knee but should also include left knee and therapeutic modalities.   Postoperative medications have already been provided including aspirin  for DVT prophylaxis.     ______________________________  Yetta Glassman [address], [place]      Madlyn Frankel. Charlann Boxer, M.D.  Electronically Signed    BLM/MEDQ  D:  12/15/2008  T:  12/15/2008  Job:  [id number]

## 2011-01-15 NOTE — H&P (Signed)
[name], [name]               ACCOUNT NO.:  [id number]   MEDICAL RECORD NO.:  [id number]          PATIENT TYPE:  INP   LOCATION:                               FACILITY:  [facility]   PHYSICIAN:  Madlyn Frankel. Charlann Boxer, M.D.  DATE OF BIRTH:  Feb 08, 1956   DATE OF ADMISSION:  11/29/2008  DATE OF DISCHARGE:                              HISTORY & PHYSICAL   PROCEDURE:  Left total knee replacement.   CHIEF COMPLAINT:  Left-knee pain.   HISTORY OF PRESENT ILLNESS:  Patient is a 55 year old female with a  history of left-knee pain secondary to osteoarthritis.  It has been  refractory to all conservative treatment, including oral anti-  inflammatories, cortisone injections, knee scopes.   PRIMARY CARE PHYSICIAN:  Dr. Alroy Dust.   PAST MEDICAL HISTORY:  1. Osteoarthritis.  2. History of mitral valve prolapse.  3. Menopausal.  4. Degenerative disk disease.   FAMILY HISTORY:  Noncontributory.   SOCIAL HISTORY:  Nonsmoker, has primary caregiver in the home  postoperatively.   DRUG ALLERGIES:  NO KNOWN DRUG ALLERGIES.   MEDICATIONS:  1. Benicar.  2. Vagifem twice a week.  3. Celebrex 2 mg one p.o. b.i.d. x2 weeks after surgery.   REVIEW OF SYSTEMS:  MUSCULOSKELETAL:  Joint swelling, joint pain as well  as morning stiffness and back pain.  Otherwise see HPI.   PHYSICAL EXAMINATION:  VITAL SIGNS:  Pulse 72, respirations 16, blood  pressure 116/76.  GENERAL:  Awake, alert and oriented.  HEENT: Normocephalic.  NECK: Supple.  No carotid bruits.  CHEST:  Lung sounds clear to auscultation bilaterally.  BREASTS:  Deferred.  HEART: S1-S2 distinct.  ABDOMEN:  Soft, bowel sounds present.  PELVIS:  Stable.  GENITOURINARY:  Deferred.  EXTREMITIES:  Left knee varus deformity and anterior knee tenderness.  SKIN:  No cellulitis.  NEUROLOGIC:  Intact distal sensibilities.   LABORATORY DATA:  Labs, chest x-ray, EKG pending presurgical testing.   IMPRESSION:  Left-knee osteoarthritis.   PLAN  OF ACTION:  Left total knee replacement by Dr. Charlann Boxer, November 29, 2008.  Risks and complications were discussed.   Postoperative medication were provided today, including aspirin for DVT  prophylaxis.     ______________________________  Yetta Glassman [address], [place]      Madlyn Frankel. Charlann Boxer, M.D.  Electronically Signed    BLM/MEDQ  D:  11/18/2008  T:  11/18/2008  Job:  [id number]   cc:   Lonzo Cloud. Kriste Basque, MD  [address]  [place]  [place] [address]

## 2011-01-15 NOTE — Op Note (Signed)
[name], [name]               ACCOUNT NO.:  [id number]   MEDICAL RECORD NO.:  [id number]          PATIENT TYPE:  INP   LOCATION:  0001                         FACILITY:  [facility]   PHYSICIAN:  Madlyn Frankel. Charlann Boxer, M.D.  DATE OF BIRTH:  1956-03-12   DATE OF PROCEDURE:  11/29/2008  DATE OF DISCHARGE:                               OPERATIVE REPORT   PREOPERATIVE DIAGNOSIS:  Left knee osteoarthritis.   POSTOPERATIVE DIAGNOSIS:  Left knee osteoarthritis.   PROCEDURE:  Left total knee replacement.   COMPONENTS USED:  DePuy rotating platform, posterior stabilized knee  system, size 2.5 femur, 2.5 tibia, 12.5 insert and A 38 patellar button.   SURGEON:  Madlyn Frankel. Charlann Boxer, M.D.   ASSISTANT:  Yetta Glassman. [address] Ave, PA   ANESTHESIA:  Duramorph spinal.   FINDINGS:  None.   SPECIMENS:  None.   ESTIMATED BLOOD LOSS:  100 mL.   TOURNIQUET:  40 minutes at 250 mmHg.   DRAINS:  One Hemovac.   COMPLICATIONS:  The patient stable to recovery room.   INDICATIONS FOR PROCEDURE:  This is a 48, soon to be 55 year old female  with bilateral knee osteoarthritis who had been treated conservatively  for some time with repeat injections.  She has failed to respond to  conservative measures for any long-term benefit.  She wished to  discussed surgical intervention.  Risks and benefits of the knee  replacement surgery were discussed including risk of infection, DVT,  component failure, need for revision surgery.  Consent was obtained for  the benefit of pain relief.   DESCRIPTION OF PROCEDURE:  The patient was brought to the operative  theater.  Once adequate anesthesia, preoperative antibiotics, Ancef  administered, the patient was positioned supine with the left thigh  tourniquet placed.  The left lower extremity was pre scrubbed, prepped  and draped in sterile fashion.  Time-out was performed identifying the  patient, planned procedure and extremity.  Leg was exsanguinated,  tourniquet elevated to 250  mmHg.  We utilized a Smith-Nephew leg holder  in this case.  The boot was carefully placed with the extra padding  around the bony prominence and the calf muscle.   A midline incision was made followed by median arthrotomy.  Following  initial synovial and fat pad debridement attention was directed to the  patella.  Precut measure was 22 mm.  I resected down to 12 mm and used a  38 patellar button, restoring the patella height.   The knee was then flexed and attention directed to the femur.  Femoral  canal was opened with a drill,  irrigated to prevent fat emboli.  An  intramedullary rod was then passed and at 3 degrees of valgus I resected  10 mm of bone off the distal femur.   The tibia was then subluxated anteriorly, meniscus and cruciate stumps  further debrided.  Using extramedullary guide I resected 10 mm of bone  based off the proximal lateral tibia.  I then checked to make sure the  knee came into full extension with extension block.  The pins were  removed.  I then  checked the cut surface again to confirm the cut was  perpendicular as I was going to base the rotation of the femoral  component off of the tibial cut.  Once this was confirmed, I attended  back to the femur.   Femoral sizing was determined be a size 2.5  Initially I used a size  three cutting block.  The rotation was set based utilizing the C-clamp  off the proximal tibia. With it in position it was definitely  perpendicular to the [facility] line and lined up nicely with epicondylar  axis on the left estimation.  It was pinned in position.  The anterior-  posterior chamfer cuts were made without difficulty.   I then made the box cut on the lateral aspect of the distal femur.  Attention was redirected back to the tibia where the cut surface  appeared to be best fit with a size 2.5 component.  It was pinned in  position through the medial third of the tubercle, drilled and keel  punched.  When I put the size  three femoral component in place I was  worried about overhang laterally.  Based on the size of the tibia, the  knee came down to at least extension with a 10-mm insert.   Given all these findings I did make a decision at this point to change  to a 2.5 femur when I looked at the trial.  Three versus 2.5.  I liked  the way the 2.5 fit best on the medial lateral and mid superior.  Based  on this, what I ended up doing was basically placing an anterior shim on  the anterior cortex of my cut, placed the 2.5 cutting block, thus  resected a little bit of more bone on the posterior aspect as well as  revising the chamfer cuts.  When I did this the size 2.5 femur fit  nicely on the medial and lateral dimension.  When I retrial the 12.5  poly appeared to fit best with full extension, stable ligaments from  extension to flexion.  At this point all trial components were removed.  I irrigated the knee with normal saline solution, injected the synovial  capsule junction with 0.25% Marcaine with epinephrine and 1 mL of  Toradol.  The knee was dried and cement mixed and final components  opened.   Final components were cemented into position.  The knee was brought to  extension with the 12.5 insert.  Extruded cement was removed.  Once  cement had cured, excess cement was removed throughout the knee.  The  final 12.5 insert was placed into the knee.  The knee was reirrigated,  tourniquet was let down at 40 minutes.  There was no significant  hemostasis required.  I did place a medium Hemovac deep.  The Hemovac  drain was placed deep, the extensor mechanism was reapproximated over  this drain with #1 Vicryl, 2-0 Vicryl and then 4-0 running Monocryl in  the skin.  Skin was cleaned, dried and dressed sterilely with Steri-  Strips and bulky sterile wrap.  She was brought to recovery room in  stable condition tolerating the procedure well.      Madlyn Frankel Charlann Boxer, M.D.  Electronically Signed     MDO/MEDQ  D:  11/29/2008  T:  11/29/2008  Job:  [id number]

## 2011-01-18 NOTE — Discharge Summary (Signed)
[name], [name]               ACCOUNT NO.:  [id number]   MEDICAL RECORD NO.:  [id number]          PATIENT TYPE:  INP   LOCATION:  1619                         FACILITY:  [facility]   PHYSICIAN:  Madlyn Frankel. Charlann Boxer, M.D.  DATE OF BIRTH:  November 25, 1955   DATE OF ADMISSION:  12/27/2008  DATE OF DISCHARGE:  12/29/2008                               DISCHARGE SUMMARY   ADMITTING DIAGNOSES:  1. Osteoarthritis.  2. History of mitral valve prolapse.  3. Postmenopausal.  4. Degenerative disk disease.   DISCHARGE DIAGNOSES:  1. Osteoarthritis.  2. History of mitral valve prolapse.  3. Menopausal.  4. Degenerative disk disease.  5. Acute blood loss anemia treated with iron.   HISTORY OF PRESENT ILLNESS:  This is a 55 year old female with a history  of right knee pain secondary to osteoarthritis, refractory to all  conservative treatment.  She has a recent history of a left total knee  replacement, as well as done well with that, has made good progress.   CONSULTATION:  None.   LABORATORY DATA:  CBC final reading:  White blood cell 6.4, hemoglobin  8.1, hematocrit 23.5, platelets 210.  White cell differential normal.  INR 1.  Routine chemistry metabolic:  Sodium 134, potassium 3.9, BUN 10,  creatinine 0.59, glucose 98, calcium 8.4.  UA negative.   HOSPITAL COURSE:  The patient admitted, underwent right total knee  replacement and admitted to the orthopedic floor.  Her stay was  unremarkable.  She remained orthopedically stable.  Hemodynamically, she  did have acute blood loss anemia and was treated with iron.  Dressing  was changed on her wound after day 1 with no significant drainage from  the wound.  She remained neurovascularly intact and her right lower  extremity was able to complete a straight-leg raise and had improving  function and increased ambulation during the course of stay.  Was seen  on December 29, 2008.  She wanted to go home, was feeling okay with no  hypotension with sitting  to standing or with ambulation.  Dressing was  changed.  Wound was dry and she was ready for discharge home,  weightbearing as tolerated.   DISCHARGE DISPOSITION:  Discharged home with home health care.  PT  stable.  Improved condition.   DISCHARGE DIET:  Heart-healthy.   DISCHARGE WOUND CARE:  Keep dry.   DISCHARGE PHYSICAL THERAPY:  Weightbearing as tolerated with use of a  rolling walker.   DISCHARGE MEDICATIONS:  1. Aspirin 325 mg 1 p.o. b.i.d. x6 weeks.  2. Robaxin 500 mg p.o. q. 6.  3. Iron 325 mg p.o. t.i.d. x2 weeks.  4. Colace 100 mg p.o. b.i.d.  5. MiraLax 70 grams p.o. daily.  6. __________ 50 mg 1-2 p.o. q. 4-6 p.r.n. pain.  7. Darvocet p.r.n.  8. Celebrex 200 mg 1 p.o. b.i.d.  9. Vagifem 50 mcg q. weekly.  10.Prilosec 20 mg p.r.n.  11.Ambien 10 mg q.h.s. p.r.n.  12.Benicar/HCTZ 40/12.5 one p.o. q.a.m.   DISCHARGE FOLLOWUP:  Follow up with Dr. Charlann Boxer at phone number [phone] in  2 weeks for wound check.  ______________________________  Yetta Glassman [address], [place]      Madlyn Frankel. Charlann Boxer, M.D.  Electronically Signed    BLM/MEDQ  D:  01/09/2009  T:  01/09/2009  Job:  [id number]   cc:   Lonzo Cloud. Kriste Basque, MD  [address]  [place]  [place] [address]

## 2011-07-19 ENCOUNTER — Encounter: Payer: Self-pay | Admitting: Pulmonary Disease

## 2011-07-19 ENCOUNTER — Ambulatory Visit (INDEPENDENT_AMBULATORY_CARE_PROVIDER_SITE_OTHER): Payer: 59 | Admitting: Pulmonary Disease

## 2011-07-19 ENCOUNTER — Other Ambulatory Visit (INDEPENDENT_AMBULATORY_CARE_PROVIDER_SITE_OTHER): Payer: 59

## 2011-07-19 DIAGNOSIS — M545 Low back pain, unspecified: Secondary | ICD-10-CM

## 2011-07-19 DIAGNOSIS — K2990 Gastroduodenitis, unspecified, without bleeding: Secondary | ICD-10-CM

## 2011-07-19 DIAGNOSIS — F419 Anxiety disorder, unspecified: Secondary | ICD-10-CM

## 2011-07-19 DIAGNOSIS — I059 Rheumatic mitral valve disease, unspecified: Secondary | ICD-10-CM

## 2011-07-19 DIAGNOSIS — K2970 Gastritis, unspecified, without bleeding: Secondary | ICD-10-CM

## 2011-07-19 DIAGNOSIS — K5730 Diverticulosis of large intestine without perforation or abscess without bleeding: Secondary | ICD-10-CM

## 2011-07-19 DIAGNOSIS — I10 Essential (primary) hypertension: Secondary | ICD-10-CM

## 2011-07-19 DIAGNOSIS — F411 Generalized anxiety disorder: Secondary | ICD-10-CM

## 2011-07-19 DIAGNOSIS — E663 Overweight: Secondary | ICD-10-CM

## 2011-07-19 DIAGNOSIS — M1990 Unspecified osteoarthritis, unspecified site: Secondary | ICD-10-CM

## 2011-07-19 DIAGNOSIS — E559 Vitamin D deficiency, unspecified: Secondary | ICD-10-CM

## 2011-07-19 LAB — CBC WITH DIFFERENTIAL/PLATELET
Basophils Absolute: 0 10*3/uL (ref 0.0–0.1)
Eosinophils Absolute: 0.1 10*3/uL (ref 0.0–0.7)
HCT: 39.2 % (ref 36.0–46.0)
Lymphs Abs: 1.8 10*3/uL (ref 0.7–4.0)
MCV: 93.5 fl (ref 78.0–100.0)
Monocytes Absolute: 0.4 10*3/uL (ref 0.1–1.0)
Platelets: 357 10*3/uL (ref 150.0–400.0)
RDW: 13.3 % (ref 11.5–14.6)

## 2011-07-19 LAB — LIPID PANEL
LDL Cholesterol: 103 mg/dL — ABNORMAL HIGH (ref 0–99)
VLDL: 5.2 mg/dL (ref 0.0–40.0)

## 2011-07-19 LAB — HEPATIC FUNCTION PANEL
AST: 18 U/L (ref 0–37)
Alkaline Phosphatase: 71 U/L (ref 39–117)
Total Bilirubin: 0.9 mg/dL (ref 0.3–1.2)

## 2011-07-19 LAB — BASIC METABOLIC PANEL
Chloride: 101 mEq/L (ref 96–112)
GFR: 100.72 mL/min (ref >60.00)
Glucose, Bld: 81 mg/dL (ref 70–99)
Potassium: 3.7 mEq/L (ref 3.5–5.1)
Sodium: 138 mEq/L (ref 135–145)

## 2011-07-19 LAB — TSH: TSH: 1.07 u[IU]/mL (ref 0.35–5.50)

## 2011-07-19 MED ORDER — OLMESARTAN MEDOXOMIL-HCTZ 40-12.5 MG PO TABS: 1.0000 | ORAL_TABLET | Freq: Every day | ORAL | Status: DC

## 2011-07-19 NOTE — Progress Notes (Signed)
Subjective:    Patient ID: [name], female    DOB: 11/11/1955, 55 y.o.   MRN: [id number]  HPI 55 y/o WF here for a follow up visit... she has multiple medical problems as noted below...  ~  August 17, 2010:  26yr [place]- she had bilat TKR's in the spring of 2010 by DrOlin, now much improved  & ambulating better w/ Prn Ibuprofen... BP controlled on meds;  denies CP, palpit, SOB, etc;  GI stable and up to date;  CXR & Fasting blood work looks good today...  ~  July 20, 2011:  60mo ROV & she describes continues knee problems Dx'd as quad tendonitis by DrOlin, sl better now w/ Pt etc;  Under some incr stress as her golden retriever recently passed;  BP well controlled on the BenicarHCT, Lipids controlled on diet alone, & GI appears stable;  See prob list below>> Labs all look good today...          Problem List:  HYPERTENSION (ICD-401.9) - on BENICAR/HCT 40-12.5 daily... BP = 110/68 today & 120's/ 70's at home, feeling well, taking med regularly & tolerating well... denies HA, fatigue, visual changes, CP, palipit, dizziness, syncope, dyspnea, edema, etc...  ~  CXR 12/11 is WNL, mild DJD spine noted...  MITRAL VALVE PROLAPSE (ICD-424.0) - clinical diagnosis she has carried for yrs w/ MSC on exam and prev hx of mild intermittent sharp CP's and palpit that were self limited... baseline EKG= NSR, WNL.Marland Kitchen. never had 2DEcho... no symptoms for several yrs- without CP, plapit, dyspnea, etc...  OVERWEIGHT (ICD-278.02) - weight = 175# today & stable... average weight was ~160# for yrs... we discussed diet + exercise therapy... ~  FLP 12/11 on diet alone showed TChol 184, TG 30, HDL 74, LDL 104 ~  FLP 11/12 on diet alone showed TChol 191, TG 26, HDL 83, LDL 103  GASTRITIS (ICD-535.50) - on OTC PRILOSEC Prn (hasn't needed in some time)... the gastritis in the past was believed secondary to NSAID's and she minimizes her exposure ever since then...  DIVERTICULOSIS OF COLON (ICD-562.10) - colonoscopy  7/09 showed divertics, sm hemorroid, no polyps... f/u planned 29yrs.  DEGENERATIVE JOINT DISEASE (ICD-715.90) - long hx of right knee arthritis and she uses ADVIL (best tolerated of the NSAIDs) Prn... s/p arthroscopy by DrPresson yrs ago... now sees DrOlin & she's been told she needs a TKR (shot in knee helped justy 2 months... she is holding off as long as poss... we discussed a trial of Mobic take w/ food. ~  s/p bilat TKRs by DrOlin- left 3/10 & right 4/10... ~  11/12:  She reports continued knee discomfort Dx'd as Quad tendonitis per DrOlin...  Hx of CARPAL TUNNEL SYNDROME (ICD-354.0)  BACK PAIN, LUMBAR (ICD-724.2) - states incr LBP after working around the house... [facility] where XRays were said to show DDD... pain improved over time.  Health Maintenance - GYN= DrTaavon for PAP smears, Mammograms, etc... she gets the yearly seasonal flu vaccines... ?had Tetanus shot from GYN in past... OK PNEUMOVAX 12/11...    Past Surgical History  Procedure Date  . Knee arthroscopy   . Bilateral tkr's 10/2008 and 12/2008    Dr. Charlann Boxer    Outpatient Encounter Prescriptions as of 07/19/2011  Medication Sig Dispense Refill  . olmesartan-hydrochlorothiazide (BENICAR HCT) 40-12.5 MG per tablet Take 1 tablet by mouth daily.          No Known Allergies   Current Medications, Allergies, Past Medical History, Past Surgical  History, Family History, and Social History were reviewed in [employer] record.     Review of Systems        The patient complains of dyspnea on exertion, stiffness, and arthritis.  The patient denies fever, chills, sweats, anorexia, fatigue, weakness, malaise, weight loss, sleep disorder, blurring, diplopia, eye irritation, eye discharge, vision loss, eye pain, photophobia, earache, ear discharge, tinnitus, decreased hearing, nasal congestion, nosebleeds, sore throat, hoarseness, chest pain, palpitations, syncope, orthopnea, PND, peripheral edema, cough,  dyspnea at rest, excessive sputum, hemoptysis, wheezing, pleurisy, nausea, vomiting, diarrhea, constipation, change in bowel habits, abdominal pain, melena, hematochezia, jaundice, gas/bloating, indigestion/heartburn, dysphagia, odynophagia, dysuria, hematuria, urinary frequency, urinary hesitancy, nocturia, incontinence, back pain, joint pain, joint swelling, muscle cramps, muscle weakness, sciatica, restless legs, leg pain at night, leg pain with exertion, rash, itching, dryness, suspicious lesions, paralysis, paresthesias, seizures, tremors, vertigo, transient blindness, frequent falls, frequent headaches, difficulty walking, depression, anxiety, memory loss, confusion, cold intolerance, heat intolerance, polydipsia, polyphagia, polyuria, unusual weight change, abnormal bruising, bleeding, enlarged lymph nodes, urticaria, allergic rash, hay fever, and recurrent infections.     Objective:   Physical Exam    WD, WN, 55 y/o WF in NAD... GENERAL:  Alert & oriented; pleasant & cooperative... HEENT:  [place]/AT, EOM-wnl, PERRLA, EACs-clear, TMs-wnl, NOSE-clear, THROAT-clear & wnl. NECK:  Supple w/ full ROM; no JVD; normal carotid impulses w/o bruits; no thyromegaly or nodules palpated; no lymphadenopathy. CHEST:  Clear to P & A; without wheezes/ rales/ or rhonchi. HEART:  Regular Rhythm; without murmurs/ rubs/ or gallops. ABDOMEN:  Soft & nontender; normal bowel sounds; no organomegaly or masses detected. EXT:  s/p bilat TKRs; no varicose veins/ venous insuffic/ or edema. NEURO:  intact w/ no focal deficits noted.  DERM:  No lesions noted; no rash etc...  RADIOLOGY DATA:  Reviewed in the EPIC EMR & discussed w/ the patient...  LABORATORY DATA:  Reviewed in the EPIC EMR & discussed w/ the patient...   Assessment & Plan:   HBP>  Controlled on the BenicaHCT + diet- low sodium, low calorie...  MVP>  She is asymptomatic w/o CP, palpit, etc...  Overweight>  She has been successful w/ her diet &  exercise as able...  Hx Gastritis>  Controlled w/ prn Prilosec...  Divertics>  She is up to date on screening colon (2009 & neg x divertics)...  DJD>  S/p bilat TKRs as noted w/ recent quad tendonitis Rx by Ortho...  Other medical problems as noted.Marland KitchenMarland Kitchen

## 2011-07-19 NOTE — Patient Instructions (Signed)
Today we updated your med list in our EPIC system...    Continue your current medications the same...  Today we did your follow up fasting blood work...    Please call the PHONE [facility]REE in a few days for your results...    Dial [id number] & when prompted enter your patient number followed by the # symbol...    Your patient number is:  [id number]#  Keep up the good work w/ your diet & stay as active as possible!  Call for any questions.Marland KitchenMarland Kitchen

## 2011-07-27 ENCOUNTER — Encounter: Payer: Self-pay | Admitting: Pulmonary Disease

## 2011-09-02 ENCOUNTER — Telehealth: Payer: Self-pay | Admitting: Pulmonary Disease

## 2011-09-02 MED ORDER — OLMESARTAN MEDOXOMIL-HCTZ 40-12.5 MG PO TABS: 1.0000 | ORAL_TABLET | Freq: Every day | ORAL | Status: DC

## 2011-09-02 NOTE — Telephone Encounter (Signed)
Spoke with pt to verify the msg and rx for benicar was sent to [facility] per pt request. She states nothing further needed for now.

## 2011-09-10 ENCOUNTER — Telehealth: Payer: Self-pay | Admitting: Pulmonary Disease

## 2011-09-10 MED ORDER — OSELTAMIVIR PHOSPHATE 75 MG PO CAPS: ORAL_CAPSULE | ORAL | Status: DC

## 2011-09-10 NOTE — Telephone Encounter (Signed)
I spoke with pt and is aware of SN recs. Tamiflu has been sent to the pharmacy. Pt aware of the directions. Nothing further was needed

## 2011-09-10 NOTE — Telephone Encounter (Signed)
Per SN---tylenol 2 po four times daily, throat lozenges prn, delsym otc 2 tsp bid , tamiflu 75mg    #10  1 po bid until gone.  thanks

## 2011-09-10 NOTE — Telephone Encounter (Signed)
Called spoke with patient who c/o slight sore throat, body aches and chills x2days.  Today is the worst for her symptoms but pt felt well enough to go to work this morning > is being sent home.  Has not checked her temperature.  I advised pt to alternate tylenol and advil prn for the body aches, drink plenty of fluids and get some rest and to promote hand-washing.  Pt verbalized her understanding but is requesting to see if SN has any other recs.  Dr Kriste Basque please advise, thanks.

## 2011-10-01 ENCOUNTER — Other Ambulatory Visit: Payer: Self-pay | Admitting: Obstetrics and Gynecology

## 2011-10-01 DIAGNOSIS — Z1231 Encounter for screening mammogram for malignant neoplasm of breast: Secondary | ICD-10-CM

## 2011-11-07 ENCOUNTER — Ambulatory Visit
Admission: RE | Admit: 2011-11-07 | Discharge: 2011-11-07 | Disposition: A | Discharge status: Home / Self Care | Payer: 59 | Source: Ambulatory Visit | Attending: Obstetrics and Gynecology | Admitting: Obstetrics and Gynecology

## 2011-11-07 DIAGNOSIS — Z1231 Encounter for screening mammogram for malignant neoplasm of breast: Secondary | ICD-10-CM

## 2012-07-09 ENCOUNTER — Encounter: Payer: Self-pay | Admitting: *Deleted

## 2012-07-10 ENCOUNTER — Telehealth: Payer: Self-pay | Admitting: Pulmonary Disease

## 2012-07-10 ENCOUNTER — Ambulatory Visit (INDEPENDENT_AMBULATORY_CARE_PROVIDER_SITE_OTHER)
Admission: RE | Admit: 2012-07-10 | Discharge: 2012-07-10 | Disposition: A | Discharge status: Home / Self Care | Payer: 59 | Source: Ambulatory Visit | Attending: Pulmonary Disease | Admitting: Pulmonary Disease

## 2012-07-10 ENCOUNTER — Other Ambulatory Visit (INDEPENDENT_AMBULATORY_CARE_PROVIDER_SITE_OTHER): Payer: 59

## 2012-07-10 ENCOUNTER — Ambulatory Visit (INDEPENDENT_AMBULATORY_CARE_PROVIDER_SITE_OTHER): Payer: 59 | Admitting: Pulmonary Disease

## 2012-07-10 ENCOUNTER — Encounter: Payer: Self-pay | Admitting: Pulmonary Disease

## 2012-07-10 VITALS — BP 112/70 | HR 82 | Temp 97.8°F | Ht 62.0 in | Wt 182.8 lb

## 2012-07-10 DIAGNOSIS — I059 Rheumatic mitral valve disease, unspecified: Secondary | ICD-10-CM

## 2012-07-10 DIAGNOSIS — M1990 Unspecified osteoarthritis, unspecified site: Secondary | ICD-10-CM

## 2012-07-10 DIAGNOSIS — K2990 Gastroduodenitis, unspecified, without bleeding: Secondary | ICD-10-CM

## 2012-07-10 DIAGNOSIS — I10 Essential (primary) hypertension: Secondary | ICD-10-CM

## 2012-07-10 DIAGNOSIS — Z0000 Encounter for general adult medical examination without abnormal findings: Secondary | ICD-10-CM

## 2012-07-10 DIAGNOSIS — M545 Low back pain, unspecified: Secondary | ICD-10-CM

## 2012-07-10 DIAGNOSIS — K5730 Diverticulosis of large intestine without perforation or abscess without bleeding: Secondary | ICD-10-CM

## 2012-07-10 DIAGNOSIS — K2970 Gastritis, unspecified, without bleeding: Secondary | ICD-10-CM

## 2012-07-10 DIAGNOSIS — E663 Overweight: Secondary | ICD-10-CM

## 2012-07-10 DIAGNOSIS — Z23 Encounter for immunization: Secondary | ICD-10-CM

## 2012-07-10 LAB — HEPATIC FUNCTION PANEL
Alkaline Phosphatase: 72 U/L (ref 39–117)
Bilirubin, Direct: 0.1 mg/dL (ref 0.0–0.3)
Total Protein: 7.3 g/dL (ref 6.0–8.3)

## 2012-07-10 LAB — URINALYSIS
Bilirubin Urine: NEGATIVE
Hgb urine dipstick: NEGATIVE
Ketones, ur: NEGATIVE
Urine Glucose: NEGATIVE
Urobilinogen, UA: 0.2 (ref 0.0–1.0)

## 2012-07-10 LAB — BASIC METABOLIC PANEL
CO2: 29 mEq/L (ref 19–32)
Calcium: 9.4 mg/dL (ref 8.4–10.5)
Chloride: 101 mEq/L (ref 96–112)
Sodium: 137 mEq/L (ref 135–145)

## 2012-07-10 LAB — CBC WITH DIFFERENTIAL/PLATELET
Basophils Absolute: 0.1 10*3/uL (ref 0.0–0.1)
Eosinophils Absolute: 0.1 10*3/uL (ref 0.0–0.7)
Hemoglobin: 13 g/dL (ref 12.0–15.0)
Lymphocytes Relative: 36.1 % (ref 12.0–46.0)
Lymphs Abs: 1.7 10*3/uL (ref 0.7–4.0)
MCHC: 33.4 g/dL (ref 30.0–36.0)
Monocytes Absolute: 0.5 10*3/uL (ref 0.1–1.0)
Neutro Abs: 2.4 10*3/uL (ref 1.4–7.7)
RDW: 13.1 % (ref 11.5–14.6)

## 2012-07-10 LAB — LIPID PANEL
HDL: 72.8 mg/dL (ref >39.00)
Total CHOL/HDL Ratio: 3

## 2012-07-10 MED ORDER — OLMESARTAN MEDOXOMIL-HCTZ 40-12.5 MG PO TABS: 1.0000 | ORAL_TABLET | Freq: Every day | ORAL | Status: DC

## 2012-07-10 NOTE — Patient Instructions (Addendum)
Today we updated your med list in our EPIC system...    Continue your current medications the same...  Let me know if you want to change the BenicarHCT and/or use [facility] to save $$$  Call for any questions...  Let's get on track w/ our diet & exercise program...  Let's continue our yearly f/u visits, sooner if needed for problems.Marland KitchenMarland Kitchen

## 2012-07-10 NOTE — Telephone Encounter (Signed)
Called and spoke with pt and she stated that SN told her to check to see if she could get the losartan cheaper.  She stated that the pharmacy will not give her a price without the dosage and how often to take it a day.  Checked with SN and he stated to see about the losartan/hctz 100/12.5 daily.  i gave this to info to the pt and she is aware.  She will call back if she needs this rx called in.

## 2012-07-11 ENCOUNTER — Encounter: Payer: Self-pay | Admitting: Pulmonary Disease

## 2012-07-11 NOTE — Progress Notes (Signed)
Subjective:    Patient ID: [name], female    DOB: 1956/06/08, 56 y.o.   MRN: [id number]  HPI 56 y/o WF here for a follow up visit... she has multiple medical problems as noted below...  ~  August 17, 2010:  65yr [place]- she had bilat TKR's in the spring of 2010 by DrOlin, now much improved  & ambulating better w/ Prn Ibuprofen... BP controlled on meds;  denies CP, palpit, SOB, etc;  GI stable and up to date;  CXR & Fasting blood work looks good today...  ~  July 20, 2011:  49mo ROV & she describes continues knee problems Dx'd as quad tendonitis by DrOlin, sl better now w/ Pt etc;  Under some incr stress as her golden retriever recently passed;  BP well controlled on the BenicarHCT, Lipids controlled on diet alone, & GI appears stable;  See prob list below>> Labs all look good today...  ~  July 10, 2012:  Year ROV & CPX> [name] has had a good yr but is upset w/ having gained 8# to 183#; we discussed diet, exercise, & wt reduction strategies; things have been stressful she says- works [employer] & they have [employer] to help their employees; her parents are elderly & ill (father= Margarito Liner)...     HBP, MVP> on BenicarHCT 40-12.5 & we discussed poss change to Hyzaar to save $$, she will decide; BP= 112/70 & denies CP, palpit, SOB, edema...    Overweight> as noted weight up 8# to 183#; we reviewed diet, exercise & wt reduction strategies; FLP looks good...    GI> Gastritis, Divertics> on OTC Prilosec as needed; last colonoscopy was 7/09 & f/u planned 31yrs...    DJD> s/p bilat TKRs by DrOlin in 2010...    Anxiety> offered anxiolytic rx but she declines... We reviewed prob list, meds, xrays and labs> see below for updates >> OK Flu shot today... CXR 11/13 showed normal heart size, clear lungs, NAD... LABS 11/13:  FLP- ok x LDL=107;  Chems- wnl;  CBC- wnl;  TSH=1.58;  UA- ...wnl          Problem List:  HYPERTENSION (ICD-401.9) - on BENICAR/HCT 40-12.5 daily...  ~   CXR 12/11 is WNL, mild DJD spine noted... ~  11/12:  BP = 110/68 today & 120's/ 70's at home & denies HA, fatigue, visual changes, CP, palipit, dizziness, syncope, dyspnea, edema, etc...  ~  11/13:  BP= 112/70 & she remains largely asymptomatic... ~  CXR 11/13 showed normal heart size, clear lungs, NAD...  MITRAL VALVE PROLAPSE (ICD-424.0) - clinical diagnosis she has carried for yrs w/ MSC on exam and prev hx of mild intermittent sharp CP's and palpit that were self limited... baseline EKG= NSR, WNL.Marland Kitchen. never had 2DEcho... no symptoms for several yrs- without CP, plapit, dyspnea, etc...  OVERWEIGHT (ICD-278.02) - weight = 175# today & stable... average weight was ~160# for yrs... we discussed diet + exercise therapy... ~  FLP 12/11 on diet alone showed TChol 184, TG 30, HDL 74, LDL 104 ~  FLP 11/12 on diet alone (wt=175#) showed TChol 191, TG 26, HDL 83, LDL 103 ~  FLP 11/13 on diet alone (wt=183#) showed TChol 189, TG 48, HDL 73, LDL 107  GASTRITIS (ICD-535.50) - on OTC PRILOSEC Prn (hasn't needed in some time)... the gastritis in the past was believed secondary to NSAID's and she minimizes her exposure ever since then...  DIVERTICULOSIS OF COLON (ICD-562.10) - colonoscopy 7/09 showed divertics, sm  hemorroid, no polyps... f/u planned 41yrs.  DEGENERATIVE JOINT DISEASE (ICD-715.90) - long hx of right knee arthritis and she uses ADVIL (best tolerated of the NSAIDs) Prn... s/p arthroscopy by DrPresson yrs ago... now sees DrOlin & she's been told she needs a TKR (shot in knee helped justy 2 months... she is holding off as long as poss... we discussed a trial of Mobic take w/ food. ~  s/p bilat TKRs by DrOlin- left 3/10 & right 4/10... ~  11/12:  She reports continued knee discomfort Dx'd as Quad tendonitis per DrOlin...  Hx of CARPAL TUNNEL SYNDROME (ICD-354.0)  BACK PAIN, LUMBAR (ICD-724.2) - states incr LBP after working around the house... went to [facility] where XRays were said to show DDD... pain  improved over time.  Health Maintenance - GYN= DrTaavon for PAP smears, Mammograms, etc... she gets the yearly seasonal flu vaccines... ?had Tetanus shot from GYN in past... OK PNEUMOVAX 12/11...    Past Surgical History  Procedure Date  . Knee arthroscopy   . Bilateral tkr's 10/2008 and 12/2008    Dr. Charlann Boxer    Outpatient Encounter Prescriptions as of 07/10/2012  Medication Sig Dispense Refill  . olmesartan-hydrochlorothiazide (BENICAR HCT) 40-12.5 MG per tablet Take 1 tablet by mouth daily.  90 tablet  3  . [DISCONTINUED] olmesartan-hydrochlorothiazide (BENICAR HCT) 40-12.5 MG per tablet Take 1 tablet by mouth daily.  90 tablet  3  . [DISCONTINUED] oseltamivir (TAMIFLU) 75 MG capsule Take 1 capsule twice a day until gone  10 capsule  0    No Known Allergies   Current Medications, Allergies, Past Medical History, Past Surgical History, Family History, and Social History were reviewed in [employer] record.     Review of Systems        The patient complains of dyspnea on exertion, stiffness, and arthritis.  The patient denies fever, chills, sweats, anorexia, fatigue, weakness, malaise, weight loss, sleep disorder, blurring, diplopia, eye irritation, eye discharge, vision loss, eye pain, photophobia, earache, ear discharge, tinnitus, decreased hearing, nasal congestion, nosebleeds, sore throat, hoarseness, chest pain, palpitations, syncope, orthopnea, PND, peripheral edema, cough, dyspnea at rest, excessive sputum, hemoptysis, wheezing, pleurisy, nausea, vomiting, diarrhea, constipation, change in bowel habits, abdominal pain, melena, hematochezia, jaundice, gas/bloating, indigestion/heartburn, dysphagia, odynophagia, dysuria, hematuria, urinary frequency, urinary hesitancy, nocturia, incontinence, back pain, joint pain, joint swelling, muscle cramps, muscle weakness, sciatica, restless legs, leg pain at night, leg pain with exertion, rash, itching, dryness, suspicious  lesions, paralysis, paresthesias, seizures, tremors, vertigo, transient blindness, frequent falls, frequent headaches, difficulty walking, depression, anxiety, memory loss, confusion, cold intolerance, heat intolerance, polydipsia, polyphagia, polyuria, unusual weight change, abnormal bruising, bleeding, enlarged lymph nodes, urticaria, allergic rash, hay fever, and recurrent infections.     Objective:   Physical Exam    WD, WN, 56 y/o WF in NAD... GENERAL:  Alert & oriented; pleasant & cooperative... HEENT:  [place]/AT, EOM-wnl, PERRLA, EACs-clear, TMs-wnl, NOSE-clear, THROAT-clear & wnl. NECK:  Supple w/ full ROM; no JVD; normal carotid impulses w/o bruits; no thyromegaly or nodules palpated; no lymphadenopathy. CHEST:  Clear to P & A; without wheezes/ rales/ or rhonchi. HEART:  Regular Rhythm; without murmurs/ rubs/ or gallops. ABDOMEN:  Soft & nontender; normal bowel sounds; no organomegaly or masses detected. EXT:  s/p bilat TKRs; no varicose veins/ venous insuffic/ or edema. NEURO:  intact w/ no focal deficits noted.  DERM:  No lesions noted; no rash etc...  RADIOLOGY DATA:  Reviewed in the EPIC EMR & discussed w/ the patient.Marland KitchenMarland Kitchen  LABORATORY DATA:  Reviewed in the EPIC EMR & discussed w/ the patient...   Assessment & Plan:    HBP>  Controlled on the BenicaHCT + diet- low sodium, low calorie; she will consider change to a cheaper ARB for $$ reasons...  MVP>  She is asymptomatic w/o CP, palpit, etc...  Overweight>  We have discussed diet, exercise, & wt reduction strategies...   Hx Gastritis>  Controlled w/ prn Prilosec...  Divertics>  She is up to date on screening colon (2009 & neg x divertics)...  DJD>  S/p bilat TKRs as noted w/ recent quad tendonitis Rx by Ortho...  Other medical problems as noted...    Patient's Medications  New Prescriptions   No medications on file  Previous Medications   No medications on file  Modified Medications   Modified Medication Previous  Medication   OLMESARTAN-HYDROCHLOROTHIAZIDE (BENICAR HCT) 40-12.5 MG PER TABLET olmesartan-hydrochlorothiazide (BENICAR HCT) 40-12.5 MG per tablet      Take 1 tablet by mouth daily.    Take 1 tablet by mouth daily.  Discontinued Medications   OSELTAMIVIR (TAMIFLU) 75 MG CAPSULE    Take 1 capsule twice a day until gone

## 2012-07-17 ENCOUNTER — Ambulatory Visit: Payer: 59 | Admitting: Pulmonary Disease

## 2012-07-23 ENCOUNTER — Ambulatory Visit: Payer: 59 | Admitting: Pulmonary Disease

## 2012-09-07 ENCOUNTER — Telehealth: Payer: Self-pay | Admitting: Pulmonary Disease

## 2012-09-07 MED ORDER — LOSARTAN POTASSIUM-HCTZ 100-12.5 MG PO TABS: 1.0000 | ORAL_TABLET | Freq: Every day | ORAL | Status: DC

## 2012-09-07 NOTE — Telephone Encounter (Signed)
Per SN---ok to send in the losarta/hctz  For the year if this is what the pt would like to have filled.  Called and spoke with pt and she is aware that this has been sent in for her.  Nothing further is needed.

## 2012-09-07 NOTE — Telephone Encounter (Signed)
Spoke with patient, patient last seen December 2013. Patient is interested with [facility]--going to switch from [facility].  Patient is requesting a ONE YEAR supply of Losartan/HCTZ 100-12.5 Take 1 tab qd.   Dr Kriste Basque please advise if okay. Patient is able to do either 90-day or ONE YEAR supply would be cheaper.

## 2012-11-04 ENCOUNTER — Other Ambulatory Visit: Payer: Self-pay

## 2012-11-04 DIAGNOSIS — Z1231 Encounter for screening mammogram for malignant neoplasm of breast: Secondary | ICD-10-CM

## 2012-12-08 ENCOUNTER — Ambulatory Visit
Admission: RE | Admit: 2012-12-08 | Discharge: 2012-12-08 | Disposition: A | Discharge status: Home / Self Care | Payer: 59 | Source: Ambulatory Visit

## 2012-12-08 DIAGNOSIS — Z1231 Encounter for screening mammogram for malignant neoplasm of breast: Secondary | ICD-10-CM

## 2013-07-08 ENCOUNTER — Other Ambulatory Visit: Payer: Self-pay

## 2013-08-31 ENCOUNTER — Encounter: Payer: Self-pay | Admitting: Pulmonary Disease

## 2013-08-31 ENCOUNTER — Ambulatory Visit (INDEPENDENT_AMBULATORY_CARE_PROVIDER_SITE_OTHER): Payer: 59 | Admitting: Pulmonary Disease

## 2013-08-31 VITALS — BP 120/82 | HR 68 | Temp 97.7°F | Ht 62.0 in | Wt 182.0 lb

## 2013-08-31 DIAGNOSIS — M545 Low back pain, unspecified: Secondary | ICD-10-CM

## 2013-08-31 DIAGNOSIS — K2970 Gastritis, unspecified, without bleeding: Secondary | ICD-10-CM

## 2013-08-31 DIAGNOSIS — M1990 Unspecified osteoarthritis, unspecified site: Secondary | ICD-10-CM

## 2013-08-31 DIAGNOSIS — E663 Overweight: Secondary | ICD-10-CM

## 2013-08-31 DIAGNOSIS — I10 Essential (primary) hypertension: Secondary | ICD-10-CM

## 2013-08-31 DIAGNOSIS — K5730 Diverticulosis of large intestine without perforation or abscess without bleeding: Secondary | ICD-10-CM

## 2013-08-31 MED ORDER — LOSARTAN POTASSIUM-HCTZ 100-12.5 MG PO TABS: 1.0000 | ORAL_TABLET | Freq: Every day | ORAL | Status: AC

## 2013-08-31 NOTE — Progress Notes (Signed)
Subjective:    Patient ID: [name], female    DOB: 09/07/1955, 57 y.o.   MRN: [id number]  HPI 57 y/o WF here for a follow up visit... she has multiple medical problems as noted below...  ~  August 17, 2010:  78yr [place]- she had bilat TKR's in the spring of 2010 by DrOlin, now much improved  & ambulating better w/ Prn Ibuprofen... BP controlled on meds;  denies CP, palpit, SOB, etc;  GI stable and up to date;  CXR & Fasting blood work looks good today...  ~  July 20, 2011:  77mo ROV & she describes continues knee problems Dx'd as quad tendonitis by DrOlin, sl better now w/ Pt etc;  Under some incr stress as her golden retriever recently passed;  BP well controlled on the BenicarHCT, Lipids controlled on diet alone, & GI appears stable;  See prob list below>> Labs all look good today...  ~  July 10, 2012:  Year ROV & CPX> [name] has had a good yr but is upset w/ having gained 8# to 183#; we discussed diet, exercise, & wt reduction strategies; things have been stressful she says- works [employer] & they have [employer] to help their employees; her parents are elderly & ill (father= Margarito Liner)...     HBP, MVP> on BenicarHCT 40-12.5 & we discussed poss change to Hyzaar to save $$, she will decide; BP= 112/70 & denies CP, palpit, SOB, edema...    Overweight> as noted weight up 8# to 183#; we reviewed diet, exercise & wt reduction strategies; FLP looks good...    GI> Gastritis, Divertics> on OTC Prilosec as needed; last colonoscopy was 7/09 & f/u planned 19yrs...    DJD> s/p bilat TKRs by DrOlin in 2010...    Anxiety> offered anxiolytic rx but she declines... We reviewed prob list, meds, xrays and labs> see below for updates >> OK Flu shot today... CXR 11/13 showed normal heart size, clear lungs, NAD... 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 We reviewed the following medical problems during today's office visit >>     HBP, MVP> on Hyzaar100-12.5; BP= 120/82 & denies CP, palpit, SOB, edema...    Overweight> her weight is stable at 182#; we reviewed diet, exercise & wt reduction strategies; prev FLPs looked good & she says it was checked 11/14 & "it was ok"...    GI> Gastritis, Divertics> on OTC Prilosec as needed; last colonoscopy was 7/09 & f/u planned 45yrs...    DJD> s/p bilat TKRs by DrOlin in 2010; she s c/o some LBP...    Anxiety> offered anxiolytic rx but she declines... We reviewed prob list, meds, xrays and labs> see below for updates >> she had the 2014 flu shot 11/14...            Problem List:  HYPERTENSION (ICD-401.9) >>  ~  prev on BENICAR/HCT 40-12.5 daily...  ~  CXR 12/11 is WNL, mild DJD spine noted... ~  11/12:  BP = 110/68 today & 120's/ 70's at home & denies HA, fatigue, visual changes, CP, palipit, dizziness, syncope, dyspnea, edema, etc...  ~  11/13:  On BenicarHCT 40-12.5; BP= 112/70 & she  remains largely asymptomatic... ~  CXR 11/13 showed normal heart size, clear lungs, NAD... ~  12/14:  On Hyzaar 100-12.5; BP= 120/82 & she denies HA, CP, palpit, SOB, dizzy, edema...  MITRAL VALVE PROLAPSE (ICD-424.0) - clinical diagnosis she has carried for yrs w/ MSC on exam and prev hx of mild intermittent sharp CP's and palpit that were self limited... baseline EKG= NSR, WNL.Marland Kitchen. never had 2DEcho... no symptoms for several yrs- without CP, plapit, dyspnea, etc...  OVERWEIGHT (ICD-278.02) >> average weight was ~160# for yrs... we discussed diet + exercise therapy... ~  FLP 12/11 on diet alone showed TChol 184, TG 30, HDL 74, LDL 104 ~  FLP 11/12 on diet alone (wt=175#) showed TChol 191, TG 26, HDL 83, LDL 103 ~  FLP 11/13 on diet alone (wt=183#) showed TChol 189, TG 48, HDL 73, LDL 107 ~  Weight 16/10 =  182#  GASTRITIS (ICD-535.50) - on OTC PRILOSEC Prn (hasn't needed in some time)... the gastritis in the past was believed secondary to NSAID's and she minimizes her exposure ever since then...  DIVERTICULOSIS OF COLON (ICD-562.10) - colonoscopy 7/09 showed divertics, sm hemorroid, no polyps... f/u planned 60yrs.  DEGENERATIVE JOINT DISEASE (ICD-715.90) - long hx of right knee arthritis and she uses ADVIL (best tolerated of the NSAIDs) Prn... s/p arthroscopy by DrPresson yrs ago... now sees DrOlin & she's been told she needs a TKR (shot in knee helped justy 2 months... she is holding off as long as poss... we discussed a trial of Mobic take w/ food. ~  s/p bilat TKRs by DrOlin- left 3/10 & right 4/10... ~  11/12:  She reports continued knee discomfort Dx'd as Quad tendonitis per DrOlin...  Hx of CARPAL TUNNEL SYNDROME (ICD-354.0)  BACK PAIN, LUMBAR (ICD-724.2) - states incr LBP after working around the house... went to [facility] where XRays were said to show DDD... pain improved over time.  Health Maintenance - GYN= DrTaavon for PAP smears, Mammograms, etc... she gets the yearly seasonal flu vaccines... ?had Tetanus shot from GYN in past... OK PNEUMOVAX 12/11...    Past Surgical History  Procedure Laterality Date  . Knee arthroscopy    . Bilateral tkr's  10/2008 and 12/2008    Dr. Charlann Boxer    Outpatient Encounter Prescriptions as of 08/31/2013  Medication Sig  . losartan-hydrochlorothiazide (HYZAAR) 100-12.5 MG per tablet Take 1 tablet by mouth daily.  . [DISCONTINUED] olmesartan-hydrochlorothiazide (BENICAR HCT) 40-12.5 MG per tablet Take 1 tablet by mouth daily.    No Known Allergies   Current Medications, Allergies, Past Medical History, Past Surgical History, Family History, and Social History were reviewed in [employer] record.     Review of Systems        The patient complains of dyspnea on exertion, stiffness, and arthritis.  The patient denies fever, chills,  sweats, anorexia, fatigue, weakness, malaise, weight loss, sleep disorder, blurring, diplopia, eye irritation, eye discharge, vision loss, eye pain, photophobia, earache, ear discharge, tinnitus, decreased hearing, nasal congestion, nosebleeds, sore throat, hoarseness, chest pain, palpitations, syncope, orthopnea, PND, peripheral edema, cough, dyspnea at rest, excessive sputum, hemoptysis, wheezing, pleurisy, nausea, vomiting, diarrhea, constipation, change in bowel habits, abdominal pain, melena, hematochezia, jaundice, gas/bloating, indigestion/heartburn, dysphagia, odynophagia, dysuria, hematuria, urinary frequency, urinary hesitancy, nocturia, incontinence, back pain, joint pain, joint swelling, muscle cramps, muscle weakness, sciatica, restless legs, leg pain at night, leg pain with exertion, rash, itching, dryness, suspicious lesions, paralysis, paresthesias, seizures, tremors, vertigo, transient blindness, frequent falls, frequent headaches, difficulty walking, depression,  anxiety, memory loss, confusion, cold intolerance, heat intolerance, polydipsia, polyphagia, polyuria, unusual weight change, abnormal bruising, bleeding, enlarged lymph nodes, urticaria, allergic rash, hay fever, and recurrent infections.     Objective:   Physical Exam    WD, WN, 57 y/o WF in NAD... GENERAL:  Alert & oriented; pleasant & cooperative... HEENT:  [place]/AT, EOM-wnl, PERRLA, EACs-clear, TMs-wnl, NOSE-clear, THROAT-clear & wnl. NECK:  Supple w/ full ROM; no JVD; normal carotid impulses w/o bruits; no thyromegaly or nodules palpated; no lymphadenopathy. CHEST:  Clear to P & A; without wheezes/ rales/ or rhonchi. HEART:  Regular Rhythm; without murmurs/ rubs/ or gallops. ABDOMEN:  Soft & nontender; normal bowel sounds; no organomegaly or masses detected. EXT:  s/p bilat TKRs; no varicose veins/ venous insuffic/ or edema. NEURO:  intact w/ no focal deficits noted.  DERM:  No lesions noted; no rash etc...  RADIOLOGY  DATA:  Reviewed in the EPIC EMR & discussed w/ the patient...  LABORATORY DATA:  Reviewed in the EPIC EMR & discussed w/ the patient...   Assessment & Plan:    HBP>  Controlled on the Hyzaar + diet- low sodium, low calorie...  MVP>  She is asymptomatic w/o CP, palpit, etc...  Overweight>  We have discussed diet, exercise, & wt reduction strategies...   Hx Gastritis>  Controlled w/ prn Prilosec...  Divertics>  She is up to date on screening colon (2009 & neg x divertics)...  DJD>  S/p bilat TKRs as noted w/ recent quad tendonitis Rx by Ortho...  Other medical problems as noted...    Patient's Medications  New Prescriptions   No medications on file  Previous Medications   No medications on file  Modified Medications   Modified Medication Previous Medication   LOSARTAN-HYDROCHLOROTHIAZIDE (HYZAAR) 100-12.5 MG PER TABLET losartan-hydrochlorothiazide (HYZAAR) 100-12.5 MG per tablet      Take 1 tablet by mouth daily.    Take 1 tablet by mouth daily.  Discontinued Medications   OLMESARTAN-HYDROCHLOROTHIAZIDE (BENICAR HCT) 40-12.5 MG PER TABLET    Take 1 tablet by mouth daily.

## 2013-08-31 NOTE — Patient Instructions (Signed)
Today we updated your med list in our EPIC system...    Continue your current medications the same...    We refilled your Hyzaar per request...  Bryan, you look great!  Take care of yourself & don't overwork...  Let's get on track w/ our diet & exercise program!!!  Call for any questions...  Let's plan a follow up visit in 58yr, sooner if needed for problems.Marland KitchenMarland Kitchen

## 2013-09-01 ENCOUNTER — Ambulatory Visit: Payer: 59 | Admitting: Pulmonary Disease

## 2013-12-07 ENCOUNTER — Other Ambulatory Visit: Payer: Self-pay

## 2013-12-07 DIAGNOSIS — Z1231 Encounter for screening mammogram for malignant neoplasm of breast: Secondary | ICD-10-CM

## 2013-12-16 ENCOUNTER — Ambulatory Visit
Admission: RE | Admit: 2013-12-16 | Discharge: 2013-12-16 | Disposition: A | Discharge status: Home / Self Care | Payer: 59 | Source: Ambulatory Visit

## 2013-12-16 DIAGNOSIS — Z1231 Encounter for screening mammogram for malignant neoplasm of breast: Secondary | ICD-10-CM

## 2014-05-19 ENCOUNTER — Telehealth: Payer: Self-pay | Admitting: Pulmonary Disease

## 2014-05-19 NOTE — Telephone Encounter (Signed)
I gave pt number and address for Dr Duanne Guess  Nothing further needed

## 2014-07-06 ENCOUNTER — Ambulatory Visit: Payer: 59 | Admitting: Pulmonary Disease

## 2014-11-16 ENCOUNTER — Other Ambulatory Visit: Payer: Self-pay

## 2014-11-16 DIAGNOSIS — Z1231 Encounter for screening mammogram for malignant neoplasm of breast: Secondary | ICD-10-CM

## 2014-12-19 ENCOUNTER — Ambulatory Visit
Admission: RE | Admit: 2014-12-19 | Discharge: 2014-12-19 | Disposition: A | Discharge status: Home / Self Care | Payer: Managed Care, Other (non HMO) | Source: Ambulatory Visit

## 2014-12-19 DIAGNOSIS — Z1231 Encounter for screening mammogram for malignant neoplasm of breast: Secondary | ICD-10-CM

## 2015-12-18 ENCOUNTER — Other Ambulatory Visit: Payer: Self-pay

## 2015-12-18 DIAGNOSIS — Z1231 Encounter for screening mammogram for malignant neoplasm of breast: Secondary | ICD-10-CM

## 2016-01-09 ENCOUNTER — Ambulatory Visit
Admission: RE | Admit: 2016-01-09 | Discharge: 2016-01-09 | Disposition: A | Discharge status: Home / Self Care | Payer: Managed Care, Other (non HMO) | Source: Ambulatory Visit

## 2016-01-09 DIAGNOSIS — Z1231 Encounter for screening mammogram for malignant neoplasm of breast: Secondary | ICD-10-CM

## 2016-02-02 ENCOUNTER — Encounter: Payer: Self-pay | Admitting: Gastroenterology

## 2016-12-09 ENCOUNTER — Other Ambulatory Visit: Payer: Self-pay | Admitting: Family Medicine

## 2016-12-09 DIAGNOSIS — Z1231 Encounter for screening mammogram for malignant neoplasm of breast: Secondary | ICD-10-CM

## 2017-01-14 ENCOUNTER — Ambulatory Visit: Payer: Managed Care, Other (non HMO)

## 2017-02-13 ENCOUNTER — Ambulatory Visit
Admission: RE | Admit: 2017-02-13 | Discharge: 2017-02-13 | Disposition: A | Discharge status: Home / Self Care | Payer: Managed Care, Other (non HMO) | Source: Ambulatory Visit | Attending: Family Medicine | Admitting: Family Medicine

## 2017-02-13 ENCOUNTER — Other Ambulatory Visit: Payer: Self-pay | Admitting: Family Medicine

## 2017-02-13 DIAGNOSIS — Z1231 Encounter for screening mammogram for malignant neoplasm of breast: Secondary | ICD-10-CM

## 2018-01-19 ENCOUNTER — Other Ambulatory Visit: Payer: Self-pay | Admitting: Family Medicine

## 2018-01-19 DIAGNOSIS — Z1231 Encounter for screening mammogram for malignant neoplasm of breast: Secondary | ICD-10-CM

## 2018-01-22 ENCOUNTER — Encounter: Payer: Self-pay | Admitting: Gastroenterology

## 2018-02-17 ENCOUNTER — Ambulatory Visit: Payer: Managed Care, Other (non HMO)

## 2018-02-18 ENCOUNTER — Ambulatory Visit: Payer: Managed Care, Other (non HMO)

## 2018-03-09 ENCOUNTER — Ambulatory Visit
Admission: RE | Admit: 2018-03-09 | Discharge: 2018-03-09 | Disposition: A | Discharge status: Home / Self Care | Payer: Managed Care, Other (non HMO) | Source: Ambulatory Visit | Attending: Family Medicine | Admitting: Family Medicine

## 2018-03-09 DIAGNOSIS — Z1231 Encounter for screening mammogram for malignant neoplasm of breast: Secondary | ICD-10-CM | POA: Diagnosis not present

## 2018-04-06 DIAGNOSIS — M65311 Trigger thumb, right thumb: Secondary | ICD-10-CM | POA: Diagnosis not present

## 2018-05-15 DIAGNOSIS — M79644 Pain in right finger(s): Secondary | ICD-10-CM | POA: Diagnosis not present

## 2018-05-15 DIAGNOSIS — M65311 Trigger thumb, right thumb: Secondary | ICD-10-CM | POA: Diagnosis not present

## 2018-08-19 DIAGNOSIS — Z0000 Encounter for general adult medical examination without abnormal findings: Secondary | ICD-10-CM | POA: Diagnosis not present

## 2018-08-21 DIAGNOSIS — Z6829 Body mass index (BMI) 29.0-29.9, adult: Secondary | ICD-10-CM | POA: Diagnosis not present

## 2018-08-21 DIAGNOSIS — E785 Hyperlipidemia, unspecified: Secondary | ICD-10-CM | POA: Diagnosis not present

## 2018-08-21 DIAGNOSIS — Z1211 Encounter for screening for malignant neoplasm of colon: Secondary | ICD-10-CM | POA: Diagnosis not present

## 2018-08-21 DIAGNOSIS — Z0000 Encounter for general adult medical examination without abnormal findings: Secondary | ICD-10-CM | POA: Diagnosis not present

## 2019-02-08 ENCOUNTER — Other Ambulatory Visit: Payer: Self-pay | Admitting: Family Medicine

## 2019-02-08 DIAGNOSIS — Z1231 Encounter for screening mammogram for malignant neoplasm of breast: Secondary | ICD-10-CM

## 2019-03-25 ENCOUNTER — Other Ambulatory Visit: Payer: Self-pay

## 2019-03-25 ENCOUNTER — Ambulatory Visit
Admission: RE | Admit: 2019-03-25 | Discharge: 2019-03-25 | Disposition: A | Discharge status: Home / Self Care | Payer: 59 | Source: Ambulatory Visit | Attending: Family Medicine | Admitting: Family Medicine

## 2019-03-25 DIAGNOSIS — Z1231 Encounter for screening mammogram for malignant neoplasm of breast: Secondary | ICD-10-CM

## 2019-05-19 ENCOUNTER — Encounter: Payer: Self-pay | Admitting: Gastroenterology

## 2019-06-18 ENCOUNTER — Other Ambulatory Visit: Payer: Self-pay

## 2019-06-18 ENCOUNTER — Ambulatory Visit (AMBULATORY_SURGERY_CENTER): Payer: Self-pay | Admitting: *Deleted

## 2019-06-18 VITALS — Temp 96.6°F | Ht 61.0 in | Wt 166.0 lb

## 2019-06-18 DIAGNOSIS — Z1211 Encounter for screening for malignant neoplasm of colon: Secondary | ICD-10-CM

## 2019-06-18 MED ORDER — NA SULFATE-K SULFATE-MG SULF 17.5-3.13-1.6 GM/177ML PO SOLN: ORAL | 0 refills | Status: DC

## 2019-06-18 NOTE — Progress Notes (Signed)
Patient is here in-person for PV. Patient denies any allergies to eggs or soy. Patient denies any problems with anesthesia/sedation. Patient denies any oxygen use at home. Patient denies taking any diet/weight loss medications or blood thinners. Patient is not being treated for MRSA or C-diff. EMMI education assisgned to patient on colonoscopy, this was explained and instructions given to patient.  suprep coupon given to pt.   Pt is aware that care partner will wait in the car during procedure; if they feel like they will be too hot or cold to wait in the car; they may wait in the 4 th floor lobby. Patient is aware to bring only one care partner. We want them to wear a mask (we do not have any that we can provide them), practice social distancing, and we will check their temperatures when they get here.  I did remind the patient that their care partner needs to stay in the parking lot the entire time and have a cell phone available, we will call them when the pt is ready for discharge. Patient will wear mask into building.  Pt declined Covid testing.  Pt also request low volume prep.

## 2019-06-21 ENCOUNTER — Encounter: Payer: Self-pay | Admitting: Gastroenterology

## 2019-07-01 ENCOUNTER — Telehealth: Payer: Self-pay | Admitting: Gastroenterology

## 2019-07-01 NOTE — Telephone Encounter (Signed)
The pt was seen by PCP and diagnosed with UTI. She has been prescribed abx and wanted to confirm she can have procedure as scheduled  for tomorrow. She was told she can have the colon as planned.

## 2019-07-02 ENCOUNTER — Other Ambulatory Visit: Payer: Self-pay

## 2019-07-02 ENCOUNTER — Ambulatory Visit (AMBULATORY_SURGERY_CENTER): Payer: 59 | Admitting: Gastroenterology

## 2019-07-02 ENCOUNTER — Encounter: Payer: Self-pay | Admitting: Gastroenterology

## 2019-07-02 VITALS — BP 111/75 | HR 63 | Temp 98.3°F | Resp 10 | Ht 61.0 in | Wt 166.0 lb

## 2019-07-02 DIAGNOSIS — Z1211 Encounter for screening for malignant neoplasm of colon: Secondary | ICD-10-CM

## 2019-07-02 DIAGNOSIS — K5730 Diverticulosis of large intestine without perforation or abscess without bleeding: Secondary | ICD-10-CM

## 2019-07-02 MED ORDER — SODIUM CHLORIDE 0.9 % IV SOLN: 500.0000 mL | Freq: Once | INTRAVENOUS | Status: AC

## 2019-07-02 NOTE — Progress Notes (Signed)
Pt's states no medical or surgical changes since previsit or office visit. VS done by CW. Temp by JB 

## 2019-07-02 NOTE — Op Note (Signed)
[facility] Patient Name: Kayden Amend Procedure Date: 07/02/2019 9:56 AM MRN: [id number] Endoscopist: Rachael Fee , MD Age: 63 Referring MD:  Date of Birth: 1955/11/11 Gender: Female Account #: [id number] Procedure:                Colonoscopy Indications:              Screening for colorectal malignant neoplasm Medicines:                Monitored Anesthesia Care Procedure:                Pre-Anesthesia Assessment:                           - Prior to the procedure, a History and Physical                            was performed, and patient medications and                            allergies were reviewed. The patient's tolerance of                            previous anesthesia was also reviewed. The risks                            and benefits of the procedure and the sedation                            options and risks were discussed with the patient.                            All questions were answered, and informed consent                            was obtained. Prior Anticoagulants: The patient has                            taken no previous anticoagulant or antiplatelet                            agents. ASA Grade Assessment: II - A patient with                            mild systemic disease. After reviewing the risks                            and benefits, the patient was deemed in                            satisfactory condition to undergo the procedure.                           After obtaining informed consent, the colonoscope  was passed under direct vision. Throughout the                            procedure, the patient's blood pressure, pulse, and                            oxygen saturations were monitored continuously. The                            Colonoscope was introduced through the anus and                            advanced to the the cecum, identified by                            appendiceal orifice and  ileocecal valve. The                            colonoscopy was performed without difficulty. The                            patient tolerated the procedure well. The quality                            of the bowel preparation was good. The ileocecal                            valve, appendiceal orifice, and rectum were                            photographed. Scope In: 10:12:59 AM Scope Out: 10:20:34 AM Scope Withdrawal Time: 0 hours 5 minutes 11 seconds  Total Procedure Duration: 0 hours 7 minutes 35 seconds  Findings:                 Multiple small and large-mouthed diverticula were                            found in the left colon.                           The exam was otherwise without abnormality on                            direct and retroflexion views. Complications:            No immediate complications. Estimated blood loss:                            None. Estimated Blood Loss:     Estimated blood loss: none. Impression:               - Diverticulosis in the left colon.                           - The examination was otherwise normal on direct  and retroflexion views.                           - No polyps or cancers. Recommendation:           - Patient has a contact number available for                            emergencies. The signs and symptoms of potential                            delayed complications were discussed with the                            patient. Return to normal activities tomorrow.                            Written discharge instructions were provided to the                            patient.                           - Resume previous diet.                           - Continue present medications.                           - Repeat colonoscopy in 10 years for screening. Rachael Feeaniel P Naysa Puskas, MD 07/02/2019 10:22:20 AM This report has been signed electronically.

## 2019-07-02 NOTE — Progress Notes (Signed)
Report to PACU, RN, vss, BBS= Clear.  

## 2019-07-02 NOTE — Patient Instructions (Signed)
YOU HAD AN ENDOSCOPIC PROCEDURE TODAY AT THE [facility]:   Refer to the procedure report that was given to you for any specific questions about what was found during the examination.  If the procedure report does not answer your questions, please call your gastroenterologist to clarify.  If you requested that your care partner not be given the details of your procedure findings, then the procedure report has been included in a sealed envelope for you to review at your convenience later.  YOU SHOULD EXPECT: Some feelings of bloating in the abdomen. Passage of more gas than usual.  Walking can help get rid of the air that was put into your GI tract during the procedure and reduce the bloating. If you had a lower endoscopy (such as a colonoscopy or flexible sigmoidoscopy) you may notice spotting of blood in your stool or on the toilet paper. If you underwent a bowel prep for your procedure, you may not have a normal bowel movement for a few days.  Please Note:  You might notice some irritation and congestion in your nose or some drainage.  This is from the oxygen used during your procedure.  There is no need for concern and it should clear up in a day or so.  SYMPTOMS TO REPORT IMMEDIATELY:   Following lower endoscopy (colonoscopy or flexible sigmoidoscopy):  Excessive amounts of blood in the stool  Significant tenderness or worsening of abdominal pains  Swelling of the abdomen that is new, acute  Fever of 100F or higher   For urgent or emergent issues, a gastroenterologist can be reached at any hour by calling [phone].   DIET:  We do recommend a small meal at first, but then you may proceed to your regular diet.  Drink plenty of fluids but you should avoid alcoholic beverages for 24 hours.  MEDICATIONS: Continue present medications.  Please see handouts given to you by your recovery nurse.  ACTIVITY:  You should plan to take it easy for the rest of today and you should  NOT DRIVE or use heavy machinery until tomorrow (because of the sedation medicines used during the test).    FOLLOW UP: Our staff will call the number listed on your records 48-72 hours following your procedure to check on you and address any questions or concerns that you may have regarding the information given to you following your procedure. If we do not reach you, we will leave a message.  We will attempt to reach you two times.  During this call, we will ask if you have developed any symptoms of COVID 19. If you develop any symptoms (ie: fever, flu-like symptoms, shortness of breath, cough etc.) before then, please call [phone].  If you test positive for Covid 19 in the 2 weeks post procedure, please call and report this information to us.    If any biopsies were taken you will be contacted by phone or by letter within the next 1-3 weeks.  Please call us at [phone] if you have not heard about the biopsies in 3 weeks.   Thank you for allowing us to provide for your healthcare needs today.   SIGNATURES/CONFIDENTIALITY: You and/or your care partner have signed paperwork which will be entered into your electronic medical record.  These signatures attest to the fact that that the information above on your After Visit Summary has been reviewed and is understood.  Full responsibility of the confidentiality of this discharge information lies with you and/or   your care-partner. 

## 2019-07-06 ENCOUNTER — Telehealth: Payer: Self-pay

## 2019-07-06 ENCOUNTER — Telehealth: Payer: Self-pay | Admitting: *Deleted

## 2019-07-06 NOTE — Telephone Encounter (Signed)
  Follow up Call-  Call back number 07/02/2019  Post procedure Call Back phone  # [phone]  Permission to leave phone message Yes  Some recent data might be hidden     Patient questions:  Do you have a fever, pain , or abdominal swelling? No. Pain Score  0 *  Have you tolerated food without any problems? Yes.    Have you been able to return to your normal activities? Yes.    Do you have any questions about your discharge instructions: Diet   No. Medications  No. Follow up visit  No.  Do you have questions or concerns about your Care? No.  Actions: * If pain score is 4 or above: No action needed, pain <4.  1. Have you developed a fever since your procedure? no  2.   Have you had an respiratory symptoms (SOB or cough) since your procedure? no  3.   Have you tested positive for COVID 19 since your procedure no  4.   Have you had any family members/close contacts diagnosed with the COVID 19 since your procedure?  no   If yes to any of these questions please route to Joylene John, RN and Alphonsa Gin, Therapist, sports.

## 2019-07-06 NOTE — Telephone Encounter (Signed)
Attempted to reach pt. With follow-up call following endoscopic procedure 07/02/2019.  LM on pt. Voice mail.  Will try to reach pt. Again later today. 

## 2020-02-22 ENCOUNTER — Other Ambulatory Visit: Payer: Self-pay | Admitting: Family Medicine

## 2020-02-22 DIAGNOSIS — Z1231 Encounter for screening mammogram for malignant neoplasm of breast: Secondary | ICD-10-CM

## 2020-03-28 ENCOUNTER — Other Ambulatory Visit: Payer: Self-pay

## 2020-03-28 ENCOUNTER — Ambulatory Visit
Admission: RE | Admit: 2020-03-28 | Discharge: 2020-03-28 | Disposition: A | Discharge status: Home / Self Care | Payer: 59 | Source: Ambulatory Visit | Attending: Family Medicine | Admitting: Family Medicine

## 2020-03-28 DIAGNOSIS — Z1231 Encounter for screening mammogram for malignant neoplasm of breast: Secondary | ICD-10-CM

## 2021-10-16 ENCOUNTER — Other Ambulatory Visit: Payer: Self-pay | Admitting: Family Medicine

## 2021-10-16 DIAGNOSIS — E2839 Other primary ovarian failure: Secondary | ICD-10-CM

## 2021-10-16 DIAGNOSIS — Z1231 Encounter for screening mammogram for malignant neoplasm of breast: Secondary | ICD-10-CM

## 2021-12-05 ENCOUNTER — Ambulatory Visit
Admission: RE | Admit: 2021-12-05 | Discharge: 2021-12-05 | Disposition: A | Discharge status: Home / Self Care | Payer: PPO | Source: Ambulatory Visit | Attending: Family Medicine | Admitting: Family Medicine

## 2021-12-05 DIAGNOSIS — Z1231 Encounter for screening mammogram for malignant neoplasm of breast: Secondary | ICD-10-CM | POA: Diagnosis not present

## 2022-01-22 DIAGNOSIS — I10 Essential (primary) hypertension: Secondary | ICD-10-CM | POA: Diagnosis not present

## 2022-01-22 DIAGNOSIS — Z23 Encounter for immunization: Secondary | ICD-10-CM | POA: Diagnosis not present

## 2022-01-22 DIAGNOSIS — E782 Mixed hyperlipidemia: Secondary | ICD-10-CM | POA: Diagnosis not present

## 2022-01-22 DIAGNOSIS — E559 Vitamin D deficiency, unspecified: Secondary | ICD-10-CM | POA: Diagnosis not present

## 2022-05-15 ENCOUNTER — Ambulatory Visit
Admission: RE | Admit: 2022-05-15 | Discharge: 2022-05-15 | Disposition: A | Discharge status: Home / Self Care | Payer: PPO | Source: Ambulatory Visit | Attending: Family Medicine | Admitting: Family Medicine

## 2022-05-15 DIAGNOSIS — Z780 Asymptomatic menopausal state: Secondary | ICD-10-CM | POA: Diagnosis not present

## 2022-05-15 DIAGNOSIS — M85851 Other specified disorders of bone density and structure, right thigh: Secondary | ICD-10-CM | POA: Diagnosis not present

## 2022-05-15 DIAGNOSIS — E2839 Other primary ovarian failure: Secondary | ICD-10-CM

## 2022-06-05 DIAGNOSIS — I10 Essential (primary) hypertension: Secondary | ICD-10-CM | POA: Diagnosis not present

## 2022-06-05 DIAGNOSIS — E782 Mixed hyperlipidemia: Secondary | ICD-10-CM | POA: Diagnosis not present

## 2022-06-12 DIAGNOSIS — Z23 Encounter for immunization: Secondary | ICD-10-CM | POA: Diagnosis not present

## 2022-06-12 DIAGNOSIS — E559 Vitamin D deficiency, unspecified: Secondary | ICD-10-CM | POA: Diagnosis not present

## 2022-06-12 DIAGNOSIS — I10 Essential (primary) hypertension: Secondary | ICD-10-CM | POA: Diagnosis not present

## 2022-06-12 DIAGNOSIS — E782 Mixed hyperlipidemia: Secondary | ICD-10-CM | POA: Diagnosis not present

## 2022-06-24 DIAGNOSIS — Z7185 Encounter for immunization safety counseling: Secondary | ICD-10-CM | POA: Diagnosis not present

## 2022-06-24 DIAGNOSIS — M8580 Other specified disorders of bone density and structure, unspecified site: Secondary | ICD-10-CM | POA: Diagnosis not present

## 2022-06-24 DIAGNOSIS — E559 Vitamin D deficiency, unspecified: Secondary | ICD-10-CM | POA: Diagnosis not present

## 2022-06-25 DIAGNOSIS — Z23 Encounter for immunization: Secondary | ICD-10-CM | POA: Diagnosis not present

## 2022-07-01 DIAGNOSIS — Z1331 Encounter for screening for depression: Secondary | ICD-10-CM | POA: Diagnosis not present

## 2022-07-01 DIAGNOSIS — Z1339 Encounter for screening examination for other mental health and behavioral disorders: Secondary | ICD-10-CM | POA: Diagnosis not present

## 2022-07-01 DIAGNOSIS — Z0000 Encounter for general adult medical examination without abnormal findings: Secondary | ICD-10-CM | POA: Diagnosis not present

## 2022-07-01 DIAGNOSIS — Z129 Encounter for screening for malignant neoplasm, site unspecified: Secondary | ICD-10-CM | POA: Diagnosis not present

## 2022-09-04 DIAGNOSIS — E782 Mixed hyperlipidemia: Secondary | ICD-10-CM | POA: Diagnosis not present

## 2022-09-11 DIAGNOSIS — E782 Mixed hyperlipidemia: Secondary | ICD-10-CM | POA: Diagnosis not present

## 2022-09-11 DIAGNOSIS — I10 Essential (primary) hypertension: Secondary | ICD-10-CM | POA: Diagnosis not present

## 2022-09-11 DIAGNOSIS — N952 Postmenopausal atrophic vaginitis: Secondary | ICD-10-CM | POA: Diagnosis not present

## 2022-09-23 IMAGING — MG MM DIGITAL SCREENING BILAT W/ TOMO AND CAD
8 series · 8 of 24 positions shown · non-contrast
Comparison: Previous exam(s).

CLINICAL DATA: Screening.

EXAM:
DIGITAL SCREENING BILATERAL MAMMOGRAM WITH TOMOSYNTHESIS AND CAD
TECHNIQUE: Bilateral screening digital craniocaudal and mediolateral oblique
mammograms were obtained. Bilateral screening digital breast
tomosynthesis was performed. The images were evaluated with
computer-aided detection.

[R MLO synth-2D]
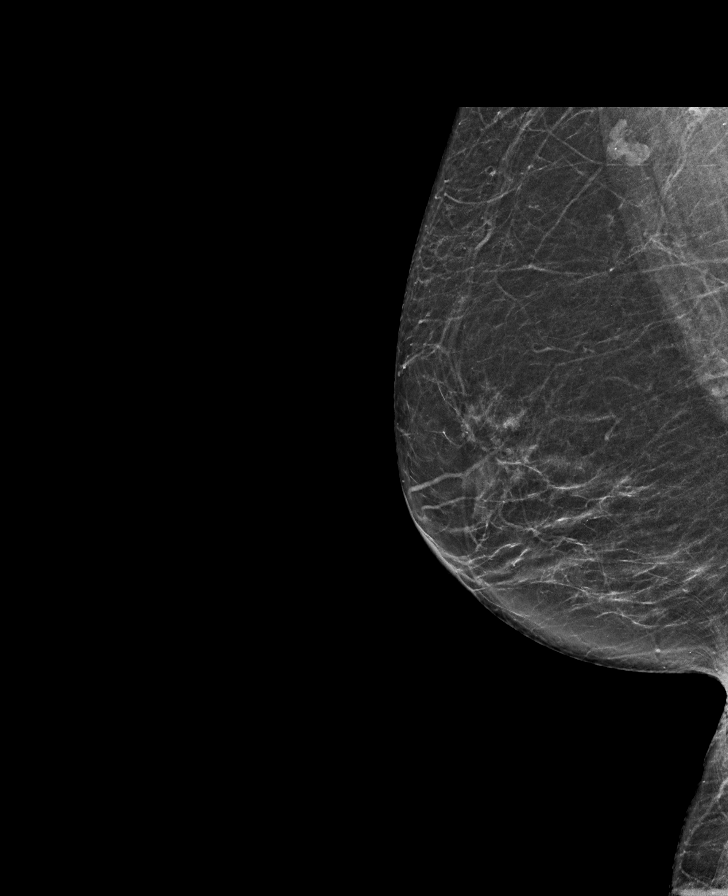

[R CC synth-2D]
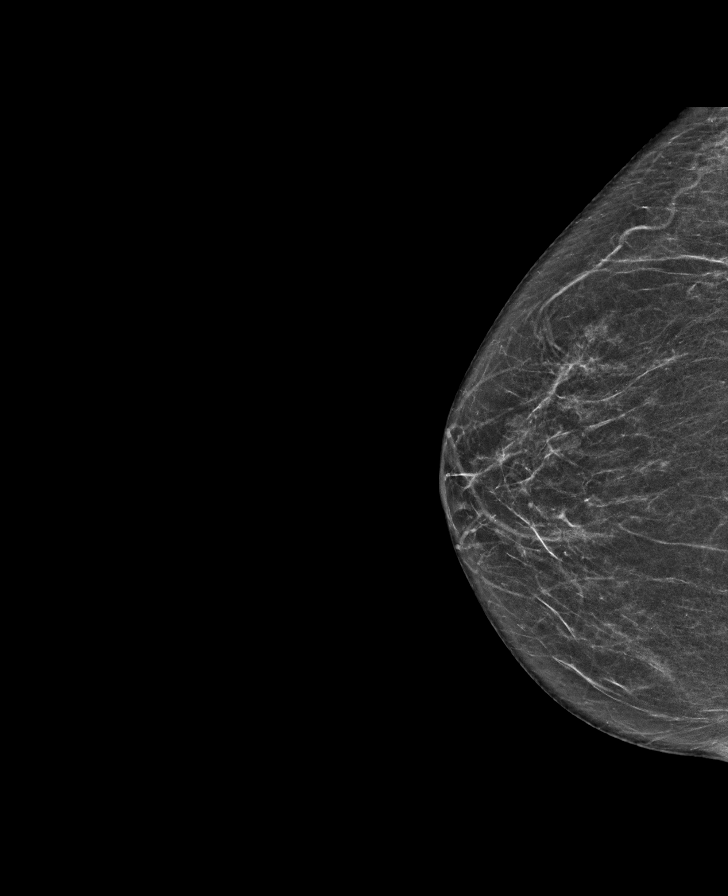

[L CC synth-2D]
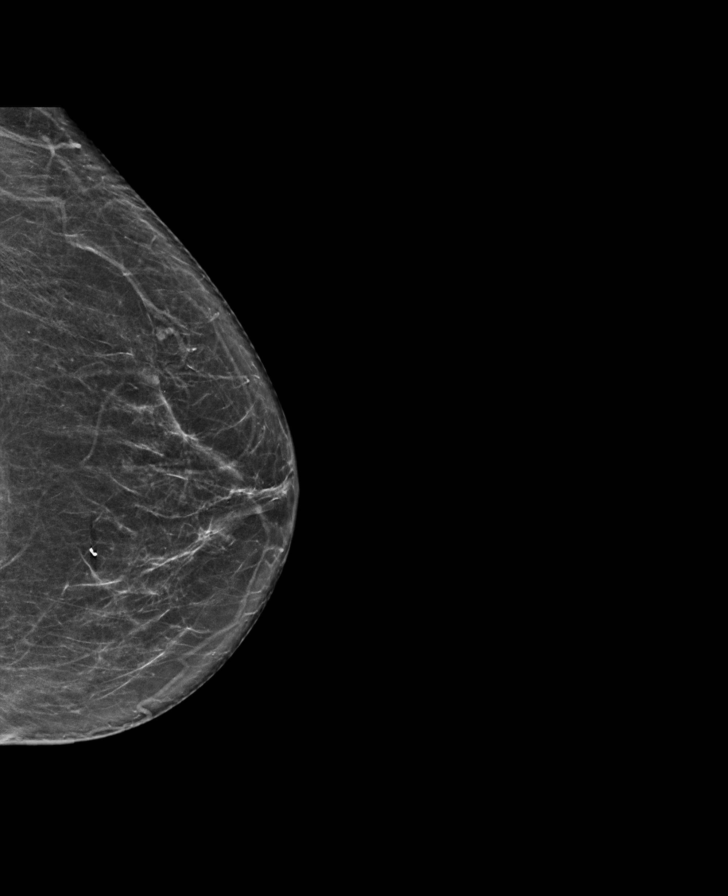

[L MLO synth-2D]
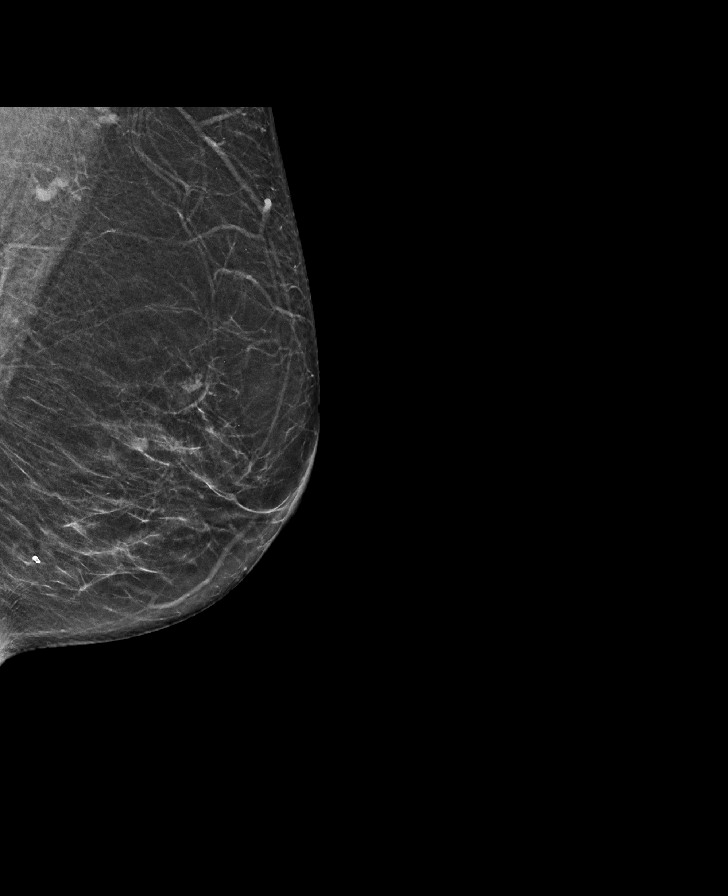

[R MLO tomo · tomo slice 35/69.0]
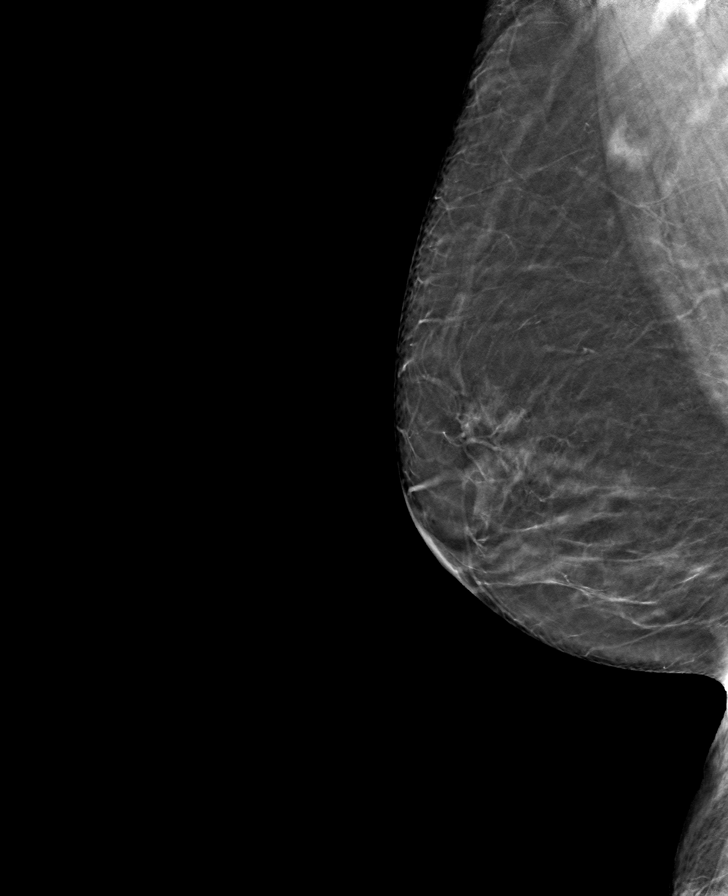

[L CC tomo · tomo slice 34/67.0]
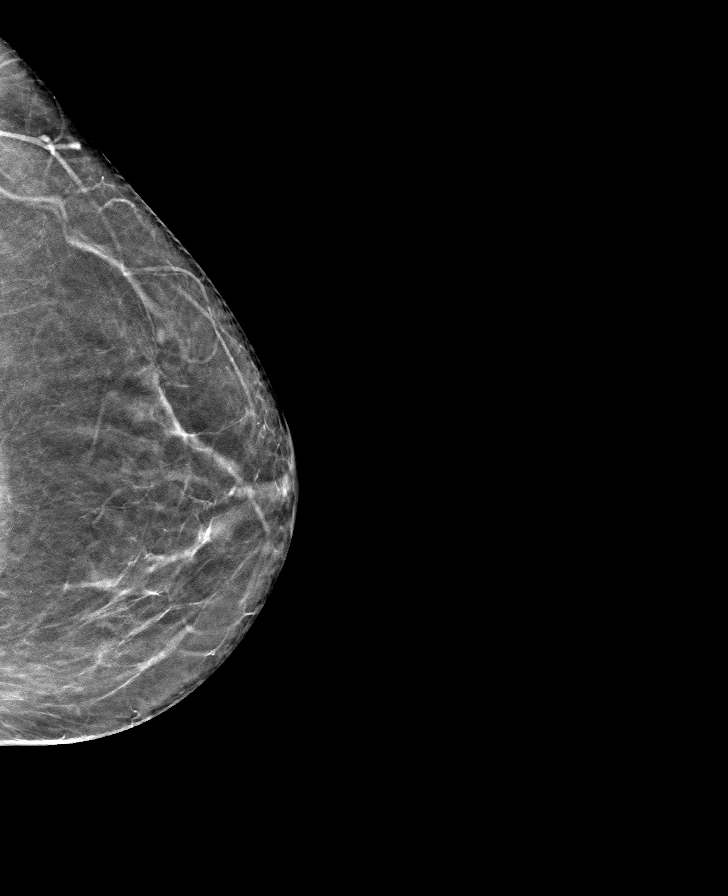

[L MLO tomo · tomo slice 34/67.0]
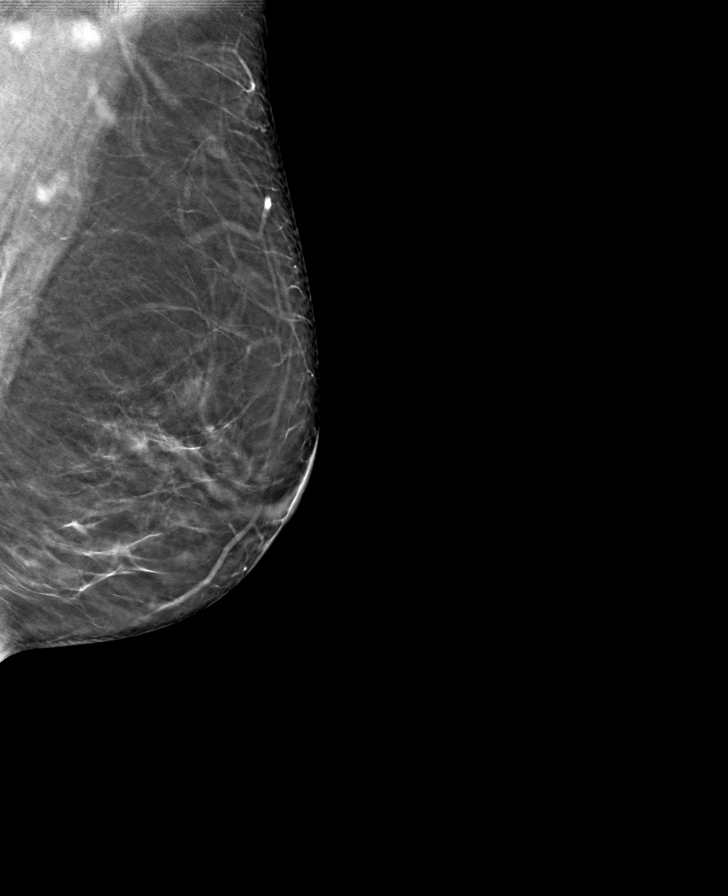

[R CC tomo · tomo slice 31/62.0]
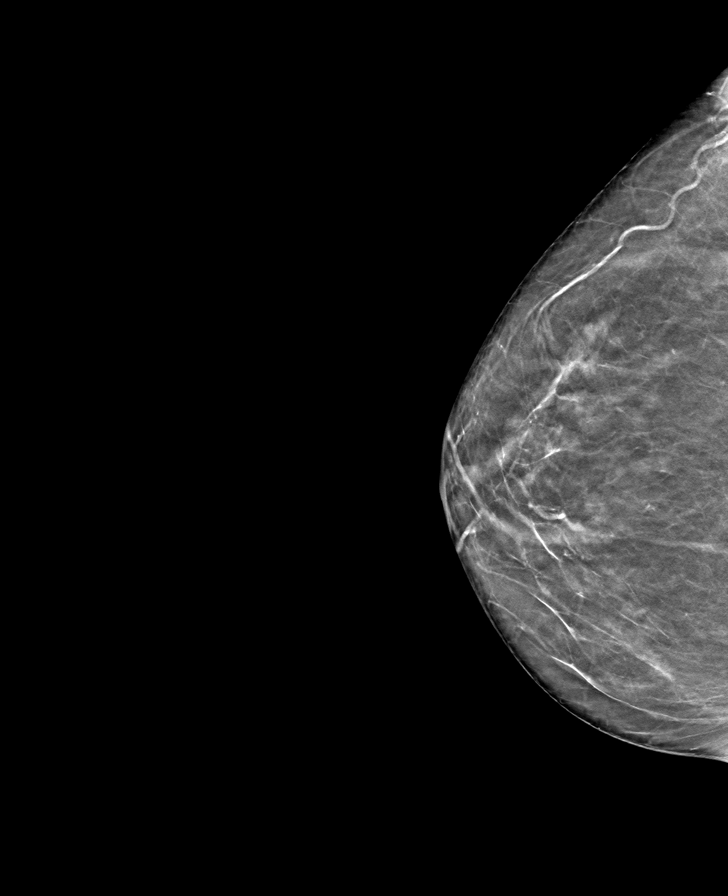

[8 of 24 positions shown; findings below may reference images not displayed]

ACR Breast Density Category b: There are scattered areas of
fibroglandular density.
FINDINGS: There are no findings suspicious for malignancy.
IMPRESSION: No mammographic evidence of malignancy. A result letter of this
screening mammogram will be mailed directly to the patient.

RECOMMENDATION:
Screening mammogram in one year. (Code:51-O-LD2)

BI-RADS CATEGORY  1: Negative.

## 2022-10-23 DIAGNOSIS — Z114 Encounter for screening for human immunodeficiency virus [HIV]: Secondary | ICD-10-CM | POA: Diagnosis not present

## 2022-10-23 DIAGNOSIS — Z0000 Encounter for general adult medical examination without abnormal findings: Secondary | ICD-10-CM | POA: Diagnosis not present

## 2022-10-23 DIAGNOSIS — Z13220 Encounter for screening for lipoid disorders: Secondary | ICD-10-CM | POA: Diagnosis not present

## 2022-10-30 DIAGNOSIS — I10 Essential (primary) hypertension: Secondary | ICD-10-CM | POA: Diagnosis not present

## 2022-10-30 DIAGNOSIS — Z0000 Encounter for general adult medical examination without abnormal findings: Secondary | ICD-10-CM | POA: Diagnosis not present

## 2022-10-30 DIAGNOSIS — H6123 Impacted cerumen, bilateral: Secondary | ICD-10-CM | POA: Diagnosis not present

## 2023-01-28 ENCOUNTER — Other Ambulatory Visit: Payer: Self-pay | Admitting: Family Medicine

## 2023-01-28 DIAGNOSIS — Z1231 Encounter for screening mammogram for malignant neoplasm of breast: Secondary | ICD-10-CM

## 2023-01-29 ENCOUNTER — Ambulatory Visit
Admission: RE | Admit: 2023-01-29 | Discharge: 2023-01-29 | Disposition: A | Discharge status: Home / Self Care | Payer: PPO | Source: Ambulatory Visit | Attending: Family Medicine | Admitting: Family Medicine

## 2023-01-29 DIAGNOSIS — Z1231 Encounter for screening mammogram for malignant neoplasm of breast: Secondary | ICD-10-CM

## 2023-02-18 DIAGNOSIS — M79602 Pain in left arm: Secondary | ICD-10-CM | POA: Diagnosis not present

## 2023-02-18 DIAGNOSIS — M25552 Pain in left hip: Secondary | ICD-10-CM | POA: Diagnosis not present

## 2023-02-18 DIAGNOSIS — S46212A Strain of muscle, fascia and tendon of other parts of biceps, left arm, initial encounter: Secondary | ICD-10-CM | POA: Diagnosis not present

## 2023-05-14 DIAGNOSIS — R5383 Other fatigue: Secondary | ICD-10-CM | POA: Diagnosis not present

## 2023-05-14 DIAGNOSIS — E785 Hyperlipidemia, unspecified: Secondary | ICD-10-CM | POA: Diagnosis not present

## 2023-05-14 DIAGNOSIS — E559 Vitamin D deficiency, unspecified: Secondary | ICD-10-CM | POA: Diagnosis not present

## 2023-05-14 DIAGNOSIS — I10 Essential (primary) hypertension: Secondary | ICD-10-CM | POA: Diagnosis not present

## 2023-05-21 DIAGNOSIS — M19049 Primary osteoarthritis, unspecified hand: Secondary | ICD-10-CM | POA: Diagnosis not present

## 2023-05-21 DIAGNOSIS — E782 Mixed hyperlipidemia: Secondary | ICD-10-CM | POA: Diagnosis not present

## 2023-05-21 DIAGNOSIS — E559 Vitamin D deficiency, unspecified: Secondary | ICD-10-CM | POA: Diagnosis not present

## 2023-05-21 DIAGNOSIS — Z23 Encounter for immunization: Secondary | ICD-10-CM | POA: Diagnosis not present

## 2023-05-21 DIAGNOSIS — I10 Essential (primary) hypertension: Secondary | ICD-10-CM | POA: Diagnosis not present

## 2023-05-21 DIAGNOSIS — Z789 Other specified health status: Secondary | ICD-10-CM | POA: Diagnosis not present

## 2023-05-21 DIAGNOSIS — N952 Postmenopausal atrophic vaginitis: Secondary | ICD-10-CM | POA: Diagnosis not present

## 2023-05-21 DIAGNOSIS — J309 Allergic rhinitis, unspecified: Secondary | ICD-10-CM | POA: Diagnosis not present

## 2023-07-16 DIAGNOSIS — Z1331 Encounter for screening for depression: Secondary | ICD-10-CM | POA: Diagnosis not present

## 2023-07-16 DIAGNOSIS — Z1339 Encounter for screening examination for other mental health and behavioral disorders: Secondary | ICD-10-CM | POA: Diagnosis not present

## 2023-07-16 DIAGNOSIS — Z0000 Encounter for general adult medical examination without abnormal findings: Secondary | ICD-10-CM | POA: Diagnosis not present

## 2023-10-29 DIAGNOSIS — Z13220 Encounter for screening for lipoid disorders: Secondary | ICD-10-CM | POA: Diagnosis not present

## 2023-10-29 DIAGNOSIS — Z0000 Encounter for general adult medical examination without abnormal findings: Secondary | ICD-10-CM | POA: Diagnosis not present

## 2023-11-05 DIAGNOSIS — Z0000 Encounter for general adult medical examination without abnormal findings: Secondary | ICD-10-CM | POA: Diagnosis not present

## 2023-11-05 DIAGNOSIS — I10 Essential (primary) hypertension: Secondary | ICD-10-CM | POA: Diagnosis not present

## 2023-12-04 DIAGNOSIS — J3489 Other specified disorders of nose and nasal sinuses: Secondary | ICD-10-CM | POA: Diagnosis not present

## 2023-12-04 DIAGNOSIS — R0981 Nasal congestion: Secondary | ICD-10-CM | POA: Diagnosis not present

## 2024-02-02 ENCOUNTER — Other Ambulatory Visit: Payer: Self-pay | Admitting: Family Medicine

## 2024-02-02 DIAGNOSIS — Z1231 Encounter for screening mammogram for malignant neoplasm of breast: Secondary | ICD-10-CM

## 2024-02-10 ENCOUNTER — Ambulatory Visit
Admission: RE | Admit: 2024-02-10 | Discharge: 2024-02-10 | Disposition: A | Discharge status: Home / Self Care | Source: Ambulatory Visit | Attending: Family Medicine | Admitting: Family Medicine

## 2024-02-10 DIAGNOSIS — Z1231 Encounter for screening mammogram for malignant neoplasm of breast: Secondary | ICD-10-CM | POA: Diagnosis not present

## 2024-02-11 DIAGNOSIS — E559 Vitamin D deficiency, unspecified: Secondary | ICD-10-CM | POA: Diagnosis not present

## 2024-02-11 DIAGNOSIS — R5383 Other fatigue: Secondary | ICD-10-CM | POA: Diagnosis not present

## 2024-02-11 DIAGNOSIS — E785 Hyperlipidemia, unspecified: Secondary | ICD-10-CM | POA: Diagnosis not present

## 2024-02-11 DIAGNOSIS — I10 Essential (primary) hypertension: Secondary | ICD-10-CM | POA: Diagnosis not present

## 2024-02-14 DIAGNOSIS — R350 Frequency of micturition: Secondary | ICD-10-CM | POA: Diagnosis not present

## 2024-02-14 DIAGNOSIS — R300 Dysuria: Secondary | ICD-10-CM | POA: Diagnosis not present

## 2024-02-14 DIAGNOSIS — M545 Low back pain, unspecified: Secondary | ICD-10-CM | POA: Diagnosis not present

## 2024-02-18 DIAGNOSIS — E559 Vitamin D deficiency, unspecified: Secondary | ICD-10-CM | POA: Diagnosis not present

## 2024-02-18 DIAGNOSIS — Z789 Other specified health status: Secondary | ICD-10-CM | POA: Diagnosis not present

## 2024-02-18 DIAGNOSIS — I10 Essential (primary) hypertension: Secondary | ICD-10-CM | POA: Diagnosis not present

## 2024-02-18 DIAGNOSIS — E782 Mixed hyperlipidemia: Secondary | ICD-10-CM | POA: Diagnosis not present

## 2024-02-18 DIAGNOSIS — M545 Low back pain, unspecified: Secondary | ICD-10-CM | POA: Diagnosis not present

## 2024-07-14 DIAGNOSIS — R5383 Other fatigue: Secondary | ICD-10-CM | POA: Diagnosis not present

## 2024-07-20 DIAGNOSIS — Z1339 Encounter for screening examination for other mental health and behavioral disorders: Secondary | ICD-10-CM | POA: Diagnosis not present

## 2024-07-20 DIAGNOSIS — F4321 Adjustment disorder with depressed mood: Secondary | ICD-10-CM | POA: Diagnosis not present

## 2024-07-20 DIAGNOSIS — Z7185 Encounter for immunization safety counseling: Secondary | ICD-10-CM | POA: Diagnosis not present

## 2024-07-20 DIAGNOSIS — Z0000 Encounter for general adult medical examination without abnormal findings: Secondary | ICD-10-CM | POA: Diagnosis not present

## 2024-07-20 DIAGNOSIS — I10 Essential (primary) hypertension: Secondary | ICD-10-CM | POA: Diagnosis not present

## 2024-07-20 DIAGNOSIS — Z1331 Encounter for screening for depression: Secondary | ICD-10-CM | POA: Diagnosis not present

## 2024-07-20 DIAGNOSIS — G4700 Insomnia, unspecified: Secondary | ICD-10-CM | POA: Diagnosis not present

## 2024-07-20 DIAGNOSIS — M8580 Other specified disorders of bone density and structure, unspecified site: Secondary | ICD-10-CM | POA: Diagnosis not present

## 2024-07-20 DIAGNOSIS — Z23 Encounter for immunization: Secondary | ICD-10-CM | POA: Diagnosis not present

## 2024-07-20 DIAGNOSIS — R7989 Other specified abnormal findings of blood chemistry: Secondary | ICD-10-CM | POA: Diagnosis not present

## 2024-07-20 DIAGNOSIS — E559 Vitamin D deficiency, unspecified: Secondary | ICD-10-CM | POA: Diagnosis not present

## 2024-08-12 DIAGNOSIS — M25511 Pain in right shoulder: Secondary | ICD-10-CM | POA: Diagnosis not present
